# Patient Record
Sex: Female | Born: 1985 | Race: Black or African American | Hispanic: No | Marital: Married | Smoking: Never smoker
Health system: Southern US, Community
[De-identification: ages and names within clinical notes are randomized; demographics above are authoritative.]

## PROBLEM LIST (undated history)

## (undated) ENCOUNTER — Inpatient Hospital Stay (HOSPITAL_COMMUNITY): Payer: Self-pay

## (undated) DIAGNOSIS — D649 Anemia, unspecified: Secondary | ICD-10-CM

## (undated) DIAGNOSIS — E669 Obesity, unspecified: Secondary | ICD-10-CM

## (undated) DIAGNOSIS — R51 Headache: Secondary | ICD-10-CM

## (undated) DIAGNOSIS — F419 Anxiety disorder, unspecified: Secondary | ICD-10-CM

## (undated) DIAGNOSIS — Z789 Other specified health status: Secondary | ICD-10-CM

## (undated) DIAGNOSIS — I10 Essential (primary) hypertension: Secondary | ICD-10-CM

## (undated) HISTORY — DX: Obesity, unspecified: E66.9

## (undated) HISTORY — DX: Headache: R51

## (undated) HISTORY — PX: APPENDECTOMY: SHX54

## (undated) HISTORY — DX: Anemia, unspecified: D64.9

---

## 2009-03-28 ENCOUNTER — Inpatient Hospital Stay (HOSPITAL_COMMUNITY): Admission: AD | Admit: 2009-03-28 | Discharge: 2009-03-28 | Payer: Self-pay | Admitting: Obstetrics & Gynecology

## 2009-04-11 ENCOUNTER — Inpatient Hospital Stay (HOSPITAL_COMMUNITY): Admission: AD | Admit: 2009-04-11 | Discharge: 2009-04-11 | Payer: Self-pay | Admitting: Obstetrics & Gynecology

## 2009-05-20 ENCOUNTER — Inpatient Hospital Stay (HOSPITAL_COMMUNITY): Admission: EM | Admit: 2009-05-20 | Discharge: 2009-05-23 | Payer: Self-pay | Admitting: Emergency Medicine

## 2009-05-20 ENCOUNTER — Encounter: Payer: Self-pay | Admitting: Obstetrics & Gynecology

## 2009-05-21 ENCOUNTER — Encounter (INDEPENDENT_AMBULATORY_CARE_PROVIDER_SITE_OTHER): Payer: Self-pay | Admitting: General Surgery

## 2009-06-05 ENCOUNTER — Emergency Department (HOSPITAL_COMMUNITY): Admission: EM | Admit: 2009-06-05 | Discharge: 2009-06-06 | Payer: Self-pay | Admitting: Emergency Medicine

## 2009-06-26 ENCOUNTER — Inpatient Hospital Stay (HOSPITAL_COMMUNITY): Admission: AD | Admit: 2009-06-26 | Discharge: 2009-06-26 | Payer: Self-pay | Admitting: Obstetrics & Gynecology

## 2009-06-26 ENCOUNTER — Ambulatory Visit: Payer: Self-pay | Admitting: Advanced Practice Midwife

## 2009-07-08 ENCOUNTER — Ambulatory Visit (HOSPITAL_COMMUNITY): Admission: RE | Admit: 2009-07-08 | Discharge: 2009-07-08 | Payer: Self-pay | Admitting: Obstetrics & Gynecology

## 2009-08-01 ENCOUNTER — Inpatient Hospital Stay (HOSPITAL_COMMUNITY): Admission: AD | Admit: 2009-08-01 | Discharge: 2009-08-01 | Payer: Self-pay | Admitting: Obstetrics & Gynecology

## 2009-08-01 ENCOUNTER — Ambulatory Visit: Payer: Self-pay | Admitting: Obstetrics and Gynecology

## 2009-09-04 ENCOUNTER — Inpatient Hospital Stay (HOSPITAL_COMMUNITY): Admission: AD | Admit: 2009-09-04 | Discharge: 2009-09-04 | Payer: Self-pay | Admitting: Obstetrics & Gynecology

## 2009-10-06 ENCOUNTER — Ambulatory Visit: Payer: Self-pay | Admitting: Obstetrics and Gynecology

## 2009-10-06 ENCOUNTER — Observation Stay (HOSPITAL_COMMUNITY): Admission: AD | Admit: 2009-10-06 | Discharge: 2009-10-07 | Payer: Self-pay | Admitting: Family Medicine

## 2009-10-20 ENCOUNTER — Inpatient Hospital Stay (HOSPITAL_COMMUNITY): Admission: AD | Admit: 2009-10-20 | Discharge: 2009-10-21 | Payer: Self-pay | Admitting: Obstetrics & Gynecology

## 2009-10-29 ENCOUNTER — Ambulatory Visit: Payer: Self-pay | Admitting: Obstetrics and Gynecology

## 2009-10-29 ENCOUNTER — Inpatient Hospital Stay (HOSPITAL_COMMUNITY): Admission: AD | Admit: 2009-10-29 | Discharge: 2009-10-29 | Payer: Self-pay | Admitting: Obstetrics & Gynecology

## 2009-11-07 ENCOUNTER — Ambulatory Visit: Payer: Self-pay | Admitting: Advanced Practice Midwife

## 2009-11-07 ENCOUNTER — Inpatient Hospital Stay (HOSPITAL_COMMUNITY): Admission: AD | Admit: 2009-11-07 | Discharge: 2009-11-07 | Payer: Self-pay | Admitting: Obstetrics & Gynecology

## 2009-11-13 ENCOUNTER — Inpatient Hospital Stay (HOSPITAL_COMMUNITY): Admission: AD | Admit: 2009-11-13 | Discharge: 2009-11-13 | Payer: Self-pay | Admitting: Obstetrics

## 2009-11-13 ENCOUNTER — Ambulatory Visit: Payer: Self-pay | Admitting: Physician Assistant

## 2009-11-16 ENCOUNTER — Inpatient Hospital Stay (HOSPITAL_COMMUNITY): Admission: AD | Admit: 2009-11-16 | Discharge: 2009-11-18 | Payer: Self-pay | Admitting: Obstetrics & Gynecology

## 2009-11-16 ENCOUNTER — Ambulatory Visit: Payer: Self-pay | Admitting: Obstetrics and Gynecology

## 2010-10-11 LAB — WET PREP, GENITAL
Trich, Wet Prep: NONE SEEN
Yeast Wet Prep HPF POC: NONE SEEN

## 2010-10-11 LAB — URINALYSIS, ROUTINE W REFLEX MICROSCOPIC
Glucose, UA: NEGATIVE mg/dL
Leukocytes, UA: NEGATIVE
Nitrite: NEGATIVE
Specific Gravity, Urine: 1.01 (ref 1.005–1.030)
Urobilinogen, UA: 0.2 mg/dL (ref 0.0–1.0)

## 2010-10-11 LAB — URINE MICROSCOPIC-ADD ON

## 2010-10-11 LAB — GC/CHLAMYDIA PROBE AMP, GENITAL: GC Probe Amp, Genital: NEGATIVE

## 2010-10-13 LAB — URINALYSIS, ROUTINE W REFLEX MICROSCOPIC
Bilirubin Urine: NEGATIVE
Bilirubin Urine: NEGATIVE
Glucose, UA: NEGATIVE mg/dL
Hgb urine dipstick: NEGATIVE
Hgb urine dipstick: NEGATIVE
Ketones, ur: 15 mg/dL — AB
Ketones, ur: NEGATIVE mg/dL
Protein, ur: NEGATIVE mg/dL
Protein, ur: NEGATIVE mg/dL
Urobilinogen, UA: 0.2 mg/dL (ref 0.0–1.0)
Urobilinogen, UA: 0.2 mg/dL (ref 0.0–1.0)

## 2010-10-13 LAB — URIC ACID: Uric Acid, Serum: 6 mg/dL (ref 2.4–7.0)

## 2010-10-13 LAB — CBC
HCT: 33.3 % — ABNORMAL LOW (ref 36.0–46.0)
HCT: 38 % (ref 36.0–46.0)
MCHC: 33.4 g/dL (ref 30.0–36.0)
MCV: 87.6 fL (ref 78.0–100.0)
MCV: 87.8 fL (ref 78.0–100.0)
Platelets: 158 10*3/uL (ref 150–400)
RBC: 4.33 MIL/uL (ref 3.87–5.11)
WBC: 6.3 10*3/uL (ref 4.0–10.5)

## 2010-10-13 LAB — COMPREHENSIVE METABOLIC PANEL
AST: 22 U/L (ref 0–37)
AST: 28 U/L (ref 0–37)
Albumin: 2.9 g/dL — ABNORMAL LOW (ref 3.5–5.2)
Albumin: 3.2 g/dL — ABNORMAL LOW (ref 3.5–5.2)
Alkaline Phosphatase: 187 U/L — ABNORMAL HIGH (ref 39–117)
BUN: 6 mg/dL (ref 6–23)
CO2: 23 mEq/L (ref 19–32)
Calcium: 9.2 mg/dL (ref 8.4–10.5)
Chloride: 103 mEq/L (ref 96–112)
Creatinine, Ser: 0.59 mg/dL (ref 0.4–1.2)
Creatinine, Ser: 0.61 mg/dL (ref 0.4–1.2)
GFR calc Af Amer: >60 mL/min (ref >60)
GFR calc Af Amer: >60 mL/min (ref >60)
GFR calc non Af Amer: >60 mL/min (ref >60)
GFR calc non Af Amer: >60 mL/min (ref >60)
Potassium: 3.8 mEq/L (ref 3.5–5.1)
Total Bilirubin: 0.7 mg/dL (ref 0.3–1.2)

## 2010-10-14 LAB — COMPREHENSIVE METABOLIC PANEL
AST: 18 U/L (ref 0–37)
BUN: 3 mg/dL — ABNORMAL LOW (ref 6–23)
CO2: 24 mEq/L (ref 19–32)
Calcium: 8.8 mg/dL (ref 8.4–10.5)
Chloride: 105 mEq/L (ref 96–112)
Creatinine, Ser: 0.44 mg/dL (ref 0.4–1.2)
GFR calc non Af Amer: >60 mL/min (ref >60)
Glucose, Bld: 94 mg/dL (ref 70–99)
Total Bilirubin: 0.4 mg/dL (ref 0.3–1.2)

## 2010-10-19 LAB — URINALYSIS, ROUTINE W REFLEX MICROSCOPIC
Bilirubin Urine: NEGATIVE
Nitrite: NEGATIVE
Specific Gravity, Urine: 1.01 (ref 1.005–1.030)
Urobilinogen, UA: 0.2 mg/dL (ref 0.0–1.0)
pH: 6 (ref 5.0–8.0)

## 2010-10-19 LAB — CBC
Hemoglobin: 11.7 g/dL — ABNORMAL LOW (ref 12.0–15.0)
MCHC: 33.7 g/dL (ref 30.0–36.0)
MCV: 86.9 fL (ref 78.0–100.0)
RBC: 4.01 MIL/uL (ref 3.87–5.11)
RDW: 12.8 % (ref 11.5–15.5)

## 2010-10-19 LAB — STREP B DNA PROBE: Strep Group B Ag: NEGATIVE

## 2010-10-27 LAB — URINALYSIS, ROUTINE W REFLEX MICROSCOPIC
Glucose, UA: NEGATIVE mg/dL
Hgb urine dipstick: NEGATIVE
Ketones, ur: NEGATIVE mg/dL
Protein, ur: NEGATIVE mg/dL
Urobilinogen, UA: 0.2 mg/dL (ref 0.0–1.0)

## 2010-10-28 LAB — CBC
HCT: 36.2 % (ref 36.0–46.0)
Hemoglobin: 11.9 g/dL — ABNORMAL LOW (ref 12.0–15.0)
MCHC: 33 g/dL (ref 30.0–36.0)
MCV: 85.5 fL (ref 78.0–100.0)
RDW: 11.7 % (ref 11.5–15.5)

## 2010-10-28 LAB — DIFFERENTIAL
Basophils Relative: 1 % (ref 0–1)
Lymphocytes Relative: 31 % (ref 12–46)
Monocytes Relative: 5 % (ref 3–12)
Neutro Abs: 5 10*3/uL (ref 1.7–7.7)
Neutrophils Relative %: 58 % (ref 43–77)

## 2010-10-28 LAB — URINALYSIS, ROUTINE W REFLEX MICROSCOPIC
Bilirubin Urine: NEGATIVE
Glucose, UA: NEGATIVE mg/dL
Ketones, ur: NEGATIVE mg/dL
Protein, ur: NEGATIVE mg/dL
pH: 5.5 (ref 5.0–8.0)

## 2010-10-28 LAB — COMPREHENSIVE METABOLIC PANEL
Alkaline Phosphatase: 35 U/L — ABNORMAL LOW (ref 39–117)
BUN: 7 mg/dL (ref 6–23)
Calcium: 9.3 mg/dL (ref 8.4–10.5)
Creatinine, Ser: 0.47 mg/dL (ref 0.4–1.2)
Glucose, Bld: 80 mg/dL (ref 70–99)
Total Protein: 7.3 g/dL (ref 6.0–8.3)

## 2010-10-29 LAB — BASIC METABOLIC PANEL
Calcium: 9.5 mg/dL (ref 8.4–10.5)
Chloride: 102 mEq/L (ref 96–112)
Creatinine, Ser: 0.47 mg/dL (ref 0.4–1.2)
GFR calc Af Amer: >60 mL/min (ref >60)
GFR calc non Af Amer: >60 mL/min (ref >60)
Potassium: 3.3 mEq/L — ABNORMAL LOW (ref 3.5–5.1)
Sodium: 134 mEq/L — ABNORMAL LOW (ref 135–145)

## 2010-10-29 LAB — DIFFERENTIAL
Basophils Absolute: 0 10*3/uL (ref 0.0–0.1)
Basophils Relative: 0 % (ref 0–1)
Lymphocytes Relative: 6 % — ABNORMAL LOW (ref 12–46)
Lymphocytes Relative: 8 % — ABNORMAL LOW (ref 12–46)
Lymphs Abs: 0.6 10*3/uL — ABNORMAL LOW (ref 0.7–4.0)
Monocytes Absolute: 0.2 10*3/uL (ref 0.1–1.0)
Monocytes Relative: 2 % — ABNORMAL LOW (ref 3–12)
Neutro Abs: 9.4 10*3/uL — ABNORMAL HIGH (ref 1.7–7.7)
Neutrophils Relative %: 90 % — ABNORMAL HIGH (ref 43–77)
Neutrophils Relative %: 92 % — ABNORMAL HIGH (ref 43–77)

## 2010-10-29 LAB — CBC
HCT: 31.8 % — ABNORMAL LOW (ref 36.0–46.0)
HCT: 33.9 % — ABNORMAL LOW (ref 36.0–46.0)
Hemoglobin: 10.6 g/dL — ABNORMAL LOW (ref 12.0–15.0)
Hemoglobin: 12.1 g/dL (ref 12.0–15.0)
MCHC: 33.5 g/dL (ref 30.0–36.0)
MCV: 86.7 fL (ref 78.0–100.0)
Platelets: 150 10*3/uL (ref 150–400)
RBC: 4.27 MIL/uL (ref 3.87–5.11)
RDW: 12.8 % (ref 11.5–15.5)
WBC: 10.3 10*3/uL (ref 4.0–10.5)
WBC: 4.9 10*3/uL (ref 4.0–10.5)

## 2010-10-29 LAB — URINALYSIS, ROUTINE W REFLEX MICROSCOPIC
Glucose, UA: NEGATIVE mg/dL
Protein, ur: NEGATIVE mg/dL
Urobilinogen, UA: 1 mg/dL (ref 0.0–1.0)

## 2010-10-29 LAB — TYPE AND SCREEN
ABO/RH(D): A POS
Antibody Screen: NEGATIVE

## 2010-10-29 LAB — ABO/RH: ABO/RH(D): A POS

## 2010-10-29 LAB — PROTIME-INR
INR: 1 (ref 0.00–1.49)
Prothrombin Time: 13.1 seconds (ref 11.6–15.2)

## 2010-10-29 LAB — APTT: aPTT: 28 seconds (ref 24–37)

## 2010-10-30 LAB — COMPREHENSIVE METABOLIC PANEL
ALT: 17 U/L (ref 0–35)
BUN: 6 mg/dL (ref 6–23)
CO2: 26 mEq/L (ref 19–32)
Calcium: 9.5 mg/dL (ref 8.4–10.5)
GFR calc non Af Amer: >60 mL/min (ref >60)
Glucose, Bld: 88 mg/dL (ref 70–99)
Sodium: 136 mEq/L (ref 135–145)

## 2010-10-30 LAB — GC/CHLAMYDIA PROBE AMP, GENITAL: Chlamydia, DNA Probe: NEGATIVE

## 2010-10-30 LAB — URINALYSIS, ROUTINE W REFLEX MICROSCOPIC
Bilirubin Urine: NEGATIVE
Glucose, UA: NEGATIVE mg/dL
Hgb urine dipstick: NEGATIVE
Ketones, ur: NEGATIVE mg/dL
Ketones, ur: NEGATIVE mg/dL
Nitrite: NEGATIVE
Protein, ur: NEGATIVE mg/dL
Specific Gravity, Urine: 1.02 (ref 1.005–1.030)
pH: 6 (ref 5.0–8.0)
pH: 7 (ref 5.0–8.0)

## 2010-10-30 LAB — CBC
HCT: 36.8 % (ref 36.0–46.0)
HCT: 38 % (ref 36.0–46.0)
Hemoglobin: 12.4 g/dL (ref 12.0–15.0)
Hemoglobin: 12.6 g/dL (ref 12.0–15.0)
MCHC: 33.6 g/dL (ref 30.0–36.0)
MCV: 85 fL (ref 78.0–100.0)
RBC: 4.33 MIL/uL (ref 3.87–5.11)
WBC: 5.5 10*3/uL (ref 4.0–10.5)

## 2010-10-30 LAB — WET PREP, GENITAL

## 2010-10-30 LAB — DIFFERENTIAL
Eosinophils Relative: 8 % — ABNORMAL HIGH (ref 0–5)
Lymphocytes Relative: 33 % (ref 12–46)
Lymphs Abs: 1.8 10*3/uL (ref 0.7–4.0)
Monocytes Absolute: 0.3 10*3/uL (ref 0.1–1.0)

## 2012-01-04 ENCOUNTER — Inpatient Hospital Stay (HOSPITAL_COMMUNITY)
Admission: AD | Admit: 2012-01-04 | Discharge: 2012-01-04 | Disposition: A | Discharge status: Home / Self Care | Payer: 59 | Source: Ambulatory Visit | Attending: Family Medicine | Admitting: Family Medicine

## 2012-01-04 ENCOUNTER — Encounter (HOSPITAL_COMMUNITY): Payer: Self-pay

## 2012-01-04 DIAGNOSIS — E860 Dehydration: Secondary | ICD-10-CM | POA: Insufficient documentation

## 2012-01-04 DIAGNOSIS — O211 Hyperemesis gravidarum with metabolic disturbance: Secondary | ICD-10-CM | POA: Insufficient documentation

## 2012-01-04 DIAGNOSIS — R1110 Vomiting, unspecified: Secondary | ICD-10-CM

## 2012-01-04 DIAGNOSIS — R1115 Cyclical vomiting syndrome unrelated to migraine: Secondary | ICD-10-CM

## 2012-01-04 HISTORY — DX: Other specified health status: Z78.9

## 2012-01-04 LAB — URINALYSIS, ROUTINE W REFLEX MICROSCOPIC
Glucose, UA: NEGATIVE mg/dL
Hgb urine dipstick: NEGATIVE
Specific Gravity, Urine: >1.03 — ABNORMAL HIGH (ref 1.005–1.030)

## 2012-01-04 LAB — CBC
HCT: 37.3 % (ref 36.0–46.0)
Hemoglobin: 12.6 g/dL (ref 12.0–15.0)
RDW: 12.2 % (ref 11.5–15.5)
WBC: 5.1 10*3/uL (ref 4.0–10.5)

## 2012-01-04 LAB — POCT PREGNANCY, URINE: Preg Test, Ur: POSITIVE — AB

## 2012-01-04 MED ORDER — PROMETHAZINE HCL 25 MG PO TABS: 25.0000 mg | ORAL_TABLET | Freq: Four times a day (QID) | ORAL | Status: DC | PRN

## 2012-01-04 MED ORDER — SODIUM CHLORIDE 0.9 % IV SOLN
25.0000 mg | Freq: Once | INTRAVENOUS | Status: AC
  Administered 2012-01-04: 25 mg via INTRAVENOUS
  Filled 2012-01-04: qty 1

## 2012-01-04 MED ORDER — ONDANSETRON 4 MG PO TBDP
4.0000 mg | ORAL_TABLET | Freq: Once | ORAL | Status: AC
  Administered 2012-01-04: 4 mg via ORAL
  Filled 2012-01-04: qty 1

## 2012-01-04 MED ORDER — SODIUM CHLORIDE 0.9 % IV SOLN
25.0000 mg | INTRAVENOUS | Status: DC
  Filled 2012-01-04: qty 1

## 2012-01-04 NOTE — MAU Note (Signed)
Pt reports for the last week she has been unable to eat anything without vomiting. Denies fever. Complains of lower abd and lower back pain also for one week. Denies dysuria.  LMP 10/25/2010

## 2012-01-04 NOTE — MAU Note (Signed)
Pt thinks last period was the first week of April but is unsure as periods have been irregular since last Depo Provera shot August 2012.

## 2012-01-04 NOTE — MAU Provider Note (Signed)
Nechama Guard y.o.G3P1011 @[redacted]w[redacted]d  by LMP Chief Complaint  Patient presents with  . Abdominal Pain    Provider at bedside at 2030    SUBJECTIVE  HPI:  26 -year-old G3 P1011 presents with nausea and vomiting of 6 days duration. She also has heartburn and spitting. Today she vomited 5 times which is more than usual. She has not taken any antiemetics. Today she feels weak and fatigued. Her menstrual cycles have been irregular since she came off Depo in August 2012.   Past Medical History  Diagnosis Date  . No pertinent past medical history    Past Surgical History  Procedure Date  . Appendectomy    History   Social History  . Marital Status: Single    Spouse Name: N/A    Number of Children: N/A  . Years of Education: N/A   Occupational History  . Not on file.   Social History Main Topics  . Smoking status: Never Smoker   . Smokeless tobacco: Not on file  . Alcohol Use: No  . Drug Use: No  . Sexually Active: Yes   Other Topics Concern  . Not on file   Social History Narrative  . No narrative on file   No current facility-administered medications on file prior to encounter.   No current outpatient prescriptions on file prior to encounter.   No Known Allergies  ROS: Pertinent items in HPI  OBJECTIVE Blood pressure 122/72, pulse 74, temperature 98.8 F (37.1 C), temperature source Oral, resp. rate 16, height 5\' 5"  (1.651 m), weight 86.183 kg (190 lb), last menstrual period 10/25/2011, SpO2 100.00%.  GENERAL: Well-developed, well-nourished female in no acute distress.  HEENT: Normocephalic, good dentition HEART: normal rate RESP: normal effort ABDOMEN: Soft, nontender EXTREMITIES: Nontender, no edema NEURO: Alert and oriented  LAB RESULTS Results for orders placed during the hospital encounter of 01/04/12 (from the past 24 hour(s))  URINALYSIS, ROUTINE W REFLEX MICROSCOPIC     Status: Abnormal   Collection Time   01/04/12  7:45 PM      Component Value Range    Color, Urine YELLOW  YELLOW    APPearance CLEAR  CLEAR    Specific Gravity, Urine >1.030 (*) 1.005 - 1.030    pH 6.0  5.0 - 8.0    Glucose, UA NEGATIVE  NEGATIVE (mg/dL)   Hgb urine dipstick NEGATIVE  NEGATIVE    Bilirubin Urine NEGATIVE  NEGATIVE    Ketones, ur 15 (*) NEGATIVE (mg/dL)   Protein, ur NEGATIVE  NEGATIVE (mg/dL)   Urobilinogen, UA 1.0  0.0 - 1.0 (mg/dL)   Nitrite NEGATIVE  NEGATIVE    Leukocytes, UA NEGATIVE  NEGATIVE   POCT PREGNANCY, URINE     Status: Abnormal   Collection Time   01/04/12  7:58 PM      Component Value Range   Preg Test, Ur POSITIVE (*) NEGATIVE      MAU Course: Zofran given. Still nauseated Bedside US by me: cardiac activity seen  ASSESSMENT Early pregnancy with nausea and vomiting Dehydration  PLAN IVF with Phenrgan Care transferred to Wynelle Bourgeois, CNM at 2100.     POE,DEIRDRE 01/04/2012 8:28 PM  CBC    Component Value Date/Time   WBC 5.1 01/04/2012 2102   RBC 4.57 01/04/2012 2102   HGB 12.6 01/04/2012 2102   HCT 37.3 01/04/2012 2102   PLT 208 01/04/2012 2102   MCV 81.6 01/04/2012 2102   MCH 27.6 01/04/2012 2102   MCHC 33.8 01/04/2012 2102  RDW 12.2 01/04/2012 2102   LYMPHSABS 2.7 06/05/2009 2035   MONOABS 0.5 06/05/2009 2035   EOSABS 0.5 06/05/2009 2035   BASOSABS 0.0 06/05/2009 2035   Feels better. Has had no vomiting Will discharge home with Rx for Phenergan Given list of OB providers

## 2012-01-04 NOTE — Discharge Instructions (Signed)
Morning Sickness Morning sickness is when you feel sick to your stomach (nauseous) during pregnancy. This nauseous feeling may or may not come with throwing up (vomiting). It often occurs in the morning, but can be a problem any time of day. While morning sickness is unpleasant, it is usually harmless unless you develop severe and continual vomiting (hyperemesis gravidarum). This condition requires more intense treatment. CAUSES  The cause of morning sickness is not completely known but seems to be related to a sudden increase of two hormones:   Human chorionic gonadotropin (hCG).   Estrogen hormone.  These are elevated in the first part of the pregnancy. TREATMENT  Do not use any medicines (prescription, over-the-counter, or herbal) for morning sickness without first talking to your caregiver. Some patients are helped by the following:  Vitamin B6 (25mg every 8 hours) or vitamin B6 shots.   An antihistamine called doxylamine (10mg every 8 hours).   The herbal medication ginger.  HOME CARE INSTRUCTIONS   Taking multivitamins before getting pregnant can prevent or decrease the severity of morning sickness in most women.   Eat a piece of dry toast or unsalted crackers before getting out of bed in the morning.   Eat 5 or 6 small meals a day.   Eat dry and bland foods (rice, baked potato).   Do not drink liquids with your meals. Drink liquids between meals.   Avoid greasy, fatty, and spicy foods.   Get someone to cook for you if the smell of any food causes nausea and vomiting.   Avoid vitamin pills with iron because iron can cause nausea.   Snack on protein foods between meals if you are hungry.   Eat unsweetened gelatins for deserts.   Wear an acupressure wristband (worn for sea sickness) may be helpful.   Acupuncture may be helpful.   Do not smoke.   Get a humidifier to keep the air in your house free of odors.  SEEK MEDICAL CARE IF:   Your home remedies are not working  and you need medication.   You feel dizzy or lightheaded.   You are losing weight.   You need help with your diet.  SEEK IMMEDIATE MEDICAL CARE IF:   You have persistent and uncontrolled nausea and vomiting.   You pass out (faint).   You have a fever.  MAKE SURE YOU:   Understand these instructions.   Will watch your condition.   Will get help right away if you are not doing well or get worse.  Document Released: 09/02/2006 Document Revised: 07/01/2011 Document Reviewed: 06/30/2007 [employer] Patient Information 2012 [employer]. 

## 2012-01-05 NOTE — MAU Provider Note (Signed)
Chart reviewed and agree with management and plan.  

## 2012-03-01 ENCOUNTER — Other Ambulatory Visit (HOSPITAL_COMMUNITY): Payer: Self-pay | Admitting: Nurse Practitioner

## 2012-03-01 DIAGNOSIS — Z3689 Encounter for other specified antenatal screening: Secondary | ICD-10-CM

## 2012-03-01 LAB — OB RESULTS CONSOLE HEPATITIS B SURFACE ANTIGEN: Hepatitis B Surface Ag: NEGATIVE

## 2012-03-01 LAB — OB RESULTS CONSOLE ABO/RH

## 2012-03-01 LAB — OB RESULTS CONSOLE HIV ANTIBODY (ROUTINE TESTING): HIV: NONREACTIVE

## 2012-03-01 LAB — OB RESULTS CONSOLE RUBELLA ANTIBODY, IGM: Rubella: IMMUNE

## 2012-03-01 LAB — OB RESULTS CONSOLE GC/CHLAMYDIA
Chlamydia: NEGATIVE
Gonorrhea: NEGATIVE

## 2012-03-07 ENCOUNTER — Ambulatory Visit (HOSPITAL_COMMUNITY)
Admission: RE | Admit: 2012-03-07 | Discharge: 2012-03-07 | Disposition: A | Discharge status: Home / Self Care | Payer: 59 | Source: Ambulatory Visit | Attending: Nurse Practitioner | Admitting: Nurse Practitioner

## 2012-03-07 DIAGNOSIS — O358XX0 Maternal care for other (suspected) fetal abnormality and damage, not applicable or unspecified: Secondary | ICD-10-CM | POA: Insufficient documentation

## 2012-03-07 DIAGNOSIS — Z1389 Encounter for screening for other disorder: Secondary | ICD-10-CM | POA: Insufficient documentation

## 2012-03-07 DIAGNOSIS — Z3689 Encounter for other specified antenatal screening: Secondary | ICD-10-CM

## 2012-03-07 DIAGNOSIS — Z363 Encounter for antenatal screening for malformations: Secondary | ICD-10-CM | POA: Insufficient documentation

## 2012-03-09 ENCOUNTER — Other Ambulatory Visit: Payer: Self-pay | Admitting: Obstetrics and Gynecology

## 2012-03-09 DIAGNOSIS — Z0489 Encounter for examination and observation for other specified reasons: Secondary | ICD-10-CM

## 2012-03-28 ENCOUNTER — Ambulatory Visit (HOSPITAL_COMMUNITY)
Admission: RE | Admit: 2012-03-28 | Discharge: 2012-03-28 | Disposition: A | Discharge status: Home / Self Care | Payer: 59 | Source: Ambulatory Visit | Attending: Obstetrics and Gynecology | Admitting: Obstetrics and Gynecology

## 2012-03-28 DIAGNOSIS — Z0489 Encounter for examination and observation for other specified reasons: Secondary | ICD-10-CM

## 2012-03-28 DIAGNOSIS — Z363 Encounter for antenatal screening for malformations: Secondary | ICD-10-CM | POA: Insufficient documentation

## 2012-03-28 DIAGNOSIS — O358XX0 Maternal care for other (suspected) fetal abnormality and damage, not applicable or unspecified: Secondary | ICD-10-CM | POA: Insufficient documentation

## 2012-03-28 DIAGNOSIS — Z1389 Encounter for screening for other disorder: Secondary | ICD-10-CM | POA: Insufficient documentation

## 2012-04-18 ENCOUNTER — Inpatient Hospital Stay (HOSPITAL_COMMUNITY)
Admission: AD | Admit: 2012-04-18 | Discharge: 2012-04-18 | Disposition: A | Discharge status: Home / Self Care | Payer: 59 | Source: Ambulatory Visit | Attending: Obstetrics & Gynecology | Admitting: Obstetrics & Gynecology

## 2012-04-18 ENCOUNTER — Encounter (HOSPITAL_COMMUNITY): Payer: Self-pay | Admitting: *Deleted

## 2012-04-18 DIAGNOSIS — T148XXA Other injury of unspecified body region, initial encounter: Secondary | ICD-10-CM

## 2012-04-18 DIAGNOSIS — R109 Unspecified abdominal pain: Secondary | ICD-10-CM

## 2012-04-18 DIAGNOSIS — O99891 Other specified diseases and conditions complicating pregnancy: Secondary | ICD-10-CM

## 2012-04-18 LAB — URINALYSIS, ROUTINE W REFLEX MICROSCOPIC
Bilirubin Urine: NEGATIVE
Ketones, ur: 15 mg/dL — AB
Leukocytes, UA: NEGATIVE
Nitrite: NEGATIVE
Specific Gravity, Urine: >1.03 — ABNORMAL HIGH (ref 1.005–1.030)
Urobilinogen, UA: 0.2 mg/dL (ref 0.0–1.0)
pH: 5.5 (ref 5.0–8.0)

## 2012-04-18 NOTE — MAU Provider Note (Signed)
History     CSN: [id number]  Arrival date and time: 04/18/12 9629   First Provider Initiated Contact with Patient 04/18/12 0259      Chief Complaint  Patient presents with  . Abdominal Pain   HPI Comments: 26 yo G3P1011 @ 25.1 p/w abdominal pain- to the right of umbilicus and superior.  Pain is sharp, constant.  No pain when laying, but + pain with movement. Happens irregularly, no LOF or vaginal bleeding. + FM. Pain started Sunday evening when she was cooking and bent down to pick something up.  Since that time, she has had sharp pain that worsens when she sits up or moves in certain directions. No dysuria, no N/V, no diarrhea or constipation.  Had appendix removed previously.  Abdominal Pain This is a new problem. The current episode started yesterday. The onset quality is sudden. The problem occurs constantly. The problem has been unchanged. The quality of the pain is sharp. The abdominal pain does not radiate. Pertinent negatives include no anorexia, belching, constipation, diarrhea, dysuria, fever, flatus, nausea or vomiting. The pain is aggravated by movement and certain positions. The pain is relieved by being still. She has tried nothing for the symptoms. Her past medical history is significant for abdominal surgery.    OB History    Grav Para Term Preterm Abortions TAB SAB Ect Mult Living   3 1 1  1 1    1       Past Medical History  Diagnosis Date  . No pertinent past medical history     Past Surgical History  Procedure Date  . Appendectomy     Family History  Problem Relation Age of Onset  . Anesthesia problems Neg Hx     History  Substance Use Topics  . Smoking status: Never Smoker   . Smokeless tobacco: Not on file  . Alcohol Use: No    Allergies: No Known Allergies  Prescriptions prior to admission  Medication Sig Dispense Refill  . Prenatal Vit-Fe Fumarate-FA (MULTIVITAMIN-PRENATAL) 27-0.8 MG TABS Take 1 tablet by mouth daily.      .  naphazoline-glycerin (CLEAR EYES) 0.012-0.2 % SOLN Place 1-2 drops into both eyes every 4 (four) hours as needed. For eye redness and irritation      . promethazine (PHENERGAN) 25 MG tablet Take 1 tablet (25 mg total) by mouth every 6 (six) hours as needed for nausea.  30 tablet  0    Review of Systems  Constitutional: Negative for fever.  Respiratory: Negative for cough.   Cardiovascular: Negative for chest pain.  Gastrointestinal: Positive for abdominal pain. Negative for nausea, vomiting, diarrhea, constipation, anorexia and flatus.  Genitourinary: Negative for dysuria and flank pain.  Neurological: Negative for dizziness.   Physical Exam   Blood pressure 128/77, pulse 82, temperature 97.2 F (36.2 C), temperature source Oral, resp. rate 18, height 5\' 4"  (1.626 m), weight 87.091 kg (192 lb), last menstrual period 10/25/2011, SpO2 100.00%.  Physical Exam  Vitals reviewed. Constitutional: She appears well-developed. No distress.  HENT:  Head: Normocephalic.  Cardiovascular: Normal rate and regular rhythm.   No murmur heard. Respiratory: Effort normal and breath sounds normal. No respiratory distress.  GI: Soft. Bowel sounds are normal. There is tenderness in the periumbilical area. There is no rebound and no CVA tenderness.         gravid  Musculoskeletal: She exhibits no edema.  Neurological: She is alert.  Skin: Skin is warm and dry.   FHT: 140's, + accels,  mod variability Toco: no ctx  MAU Course  Procedures  MDM Differential includes muscle strain vs post-op adhesions. Reassuring fetal monitoring. No ctx on toco.   Assessment and Plan  26 yo G3P1011 @ 25.1 presents with muscle strain -Tylenol PRN -Heat, ice or icy hot PRN, whatever gives most pain relief -Monitor fetal kick counts -F/u with HD as previously scheduled  Fiana Gladu 04/18/2012, 2:59 AM

## 2012-04-18 NOTE — MAU Note (Signed)
Patient indicates pain in her mid abd x 2 days. Denies bleeding. Some dysuria

## 2012-04-18 NOTE — MAU Provider Note (Signed)
I saw and examined patient and read NST. I agree with above. Napoleon Form, MD

## 2012-05-25 ENCOUNTER — Inpatient Hospital Stay (HOSPITAL_COMMUNITY)
Admission: AD | Admit: 2012-05-25 | Discharge: 2012-05-25 | Disposition: A | Discharge status: Home / Self Care | Payer: 59 | Source: Ambulatory Visit | Attending: Obstetrics & Gynecology | Admitting: Obstetrics & Gynecology

## 2012-05-25 ENCOUNTER — Encounter (HOSPITAL_COMMUNITY): Payer: Self-pay | Admitting: *Deleted

## 2012-05-25 DIAGNOSIS — O99891 Other specified diseases and conditions complicating pregnancy: Secondary | ICD-10-CM | POA: Insufficient documentation

## 2012-05-25 DIAGNOSIS — R51 Headache: Secondary | ICD-10-CM | POA: Insufficient documentation

## 2012-05-25 DIAGNOSIS — J039 Acute tonsillitis, unspecified: Secondary | ICD-10-CM

## 2012-05-25 DIAGNOSIS — J029 Acute pharyngitis, unspecified: Secondary | ICD-10-CM | POA: Insufficient documentation

## 2012-05-25 DIAGNOSIS — J40 Bronchitis, not specified as acute or chronic: Secondary | ICD-10-CM | POA: Insufficient documentation

## 2012-05-25 LAB — URINALYSIS, ROUTINE W REFLEX MICROSCOPIC
Bilirubin Urine: NEGATIVE
Hgb urine dipstick: NEGATIVE
Nitrite: NEGATIVE
Protein, ur: NEGATIVE mg/dL
Urobilinogen, UA: 1 mg/dL (ref 0.0–1.0)

## 2012-05-25 MED ORDER — GUAIFENESIN-CODEINE 100-10 MG/5ML PO SYRP: 5.0000 mL | ORAL_SOLUTION | Freq: Three times a day (TID) | ORAL | Status: DC | PRN

## 2012-05-25 MED ORDER — AZITHROMYCIN 250 MG PO TABS: 250.0000 mg | ORAL_TABLET | Freq: Every day | ORAL | Status: DC

## 2012-05-25 NOTE — MAU Note (Signed)
Pt states that she started having a headache last night and woke up with a fever and sore throat

## 2012-05-25 NOTE — MAU Provider Note (Signed)
Attestation of Attending Supervision of Advanced Practitioner (CNM/NP): Evaluation and management procedures were performed by the Advanced Practitioner under my supervision and collaboration.  I have reviewed the Advanced Practitioner's note and chart, and I agree with the management and plan.  HARRAWAY-SMITH, Latwan Luchsinger 4:51 PM    

## 2012-05-25 NOTE — MAU Provider Note (Signed)
History     CSN: [id number]  Arrival date and time: 05/25/12 1423   First Provider Initiated Contact with Patient 05/25/12 1500      Chief Complaint  Patient presents with  . Headache  . Sore Throat   HPI Annette Sullivan is a 26 y.o. female @ [redacted]w[redacted]d gestation who presents to MAU with sore throat. The sore throat started yesterday. She rates the pain as 8/10. Associated symptoms include fever, chills, ear ache, headache, and aching all over. Productive cough. No problems with the pregnancy today. The history was provided by the patient.  OB History    Grav Para Term Preterm Abortions TAB SAB Ect Mult Living   3 1 1  1 1    1       Past Medical History  Diagnosis Date  . No pertinent past medical history     Past Surgical History  Procedure Date  . Appendectomy     Family History  Problem Relation Age of Onset  . Anesthesia problems Neg Hx     History  Substance Use Topics  . Smoking status: Never Smoker   . Smokeless tobacco: Not on file  . Alcohol Use: No    Allergies: No Known Allergies  Prescriptions prior to admission  Medication Sig Dispense Refill  . acetaminophen (TYLENOL) 325 MG tablet Take 650 mg by mouth every 6 (six) hours as needed. For pain/sore throat      . Prenatal Vit-Fe Fumarate-FA (MULTIVITAMIN-PRENATAL) 27-0.8 MG TABS Take 1 tablet by mouth daily.      . promethazine (PHENERGAN) 25 MG tablet Take 1 tablet (25 mg total) by mouth every 6 (six) hours as needed for nausea.  30 tablet  0    Review of Systems  Constitutional: Positive for fever, chills and malaise/fatigue. Negative for weight loss.  HENT: Positive for ear pain, sore throat and neck pain. Negative for nosebleeds and congestion.   Eyes: Negative for blurred vision, double vision, photophobia and pain.  Respiratory: Positive for cough and sputum production. Negative for shortness of breath and wheezing.   Cardiovascular: Negative for chest pain and palpitations.  Gastrointestinal:  Positive for nausea and constipation. Negative for heartburn, vomiting, abdominal pain and diarrhea.  Genitourinary: Negative for dysuria, urgency and frequency.  Musculoskeletal: Negative for myalgias and back pain.  Skin: Negative for itching and rash.  Neurological: Positive for headaches. Negative for dizziness, sensory change, speech change, seizures and weakness.  Endo/Heme/Allergies: Does not bruise/bleed easily.  Psychiatric/Behavioral: Negative for depression. The patient is not nervous/anxious and does not have insomnia.    Physical Exam   Blood pressure 139/73, pulse 108, temperature 99.2 F (37.3 C), temperature source Oral, resp. rate 18, last menstrual period 10/25/2011.  Physical Exam  Nursing note and vitals reviewed. Constitutional: She is oriented to person, place, and time. She appears well-developed and well-nourished. No distress.  HENT:  Head: Normocephalic.  Right Ear: Tympanic membrane and external ear normal.  Left Ear: Tympanic membrane and external ear normal.  Mouth/Throat: Uvula is midline and mucous membranes are normal. Oropharyngeal exudate and posterior oropharyngeal erythema present.  Eyes: EOM are normal.  Neck: Trachea normal. Neck supple. Muscular tenderness present.       No meningeal signs present.  Cardiovascular:       tachycardia  Respiratory: Effort normal. She has no wheezes. She has no rales.  GI: Soft. There is no tenderness.  Musculoskeletal: Normal range of motion.  Lymphadenopathy:    She has cervical adenopathy.  Neurological: She is alert and oriented to person, place, and time. She has normal strength. No cranial nerve deficit.  Skin: Skin is warm and dry.  Psychiatric: She has a normal mood and affect. Her behavior is normal. Judgment and thought content normal.   EFM: Baseline 155, good accelerations, no decelerations, no contractins  Assessment: 26 y.o. female @ [redacted]w[redacted]d gestation with sore  throat   Pharyngitis/tonsillitis   Bronchitis  Plan:  Z-Pak   Robitussin AC   Return if symptoms worse.   Discussed in detail with patient signs and symptoms to return for. Patient voices understanding. Patient questions answered.   Medication List     As of 05/25/2012  3:33 PM    START taking these medications         azithromycin 250 MG tablet   Commonly known as: ZITHROMAX   Take 1 tablet (250 mg total) by mouth daily. Take first 2 tablets together, then 1 every day until finished.      guaiFENesin-codeine 100-10 MG/5ML syrup   Commonly known as: ROBITUSSIN AC   Take 5 mLs by mouth 3 (three) times daily as needed for cough.      CONTINUE taking these medications         acetaminophen 325 MG tablet   Commonly known as: TYLENOL      multivitamin-prenatal 27-0.8 MG Tabs      promethazine 25 MG tablet   Commonly known as: PHENERGAN   Take 1 tablet (25 mg total) by mouth every 6 (six) hours as needed for nausea.          Where to get your medications    These are the prescriptions that you need to pick up.   You may get these medications from any pharmacy.         azithromycin 250 MG tablet   guaiFENesin-codeine 100-10 MG/5ML syrup               MAU Course: Discussed with Dr. Erin Fulling  Procedures  Dayon Witt, RN, FNP, [facility] 05/25/2012, 3:02 PM

## 2012-07-26 NOTE — L&D Delivery Note (Signed)
Delivery Note LEYDA VANDERWERF is a 27 y.o. [id number] presenting at [redacted]w[redacted]d with elevated blood pressures. Her CMP, platelets and urine protein/creatinine ratio were negative for preeclampsia. She was 4.5 cm dilated on admission and was started on pitocin and had membranes artificially ruptured. She began contracting immediately and progressed precipitously to complete dilation.   At 6:03 PM a viable female was delivered via Vaginal, Spontaneous Delivery (Presentation: ; Occiput Anterior; right hand presenting with face).  APGAR: 9, 9; weight 7 lb 11 oz.   Placenta status: Intact, Spontaneous.  Cord: 3 vessels with the following complications: None.    Anesthesia: Local Epidural  Episiotomy: None Lacerations: 2nd degree;Perineal Suture Repair: 3.0 vicryl Est. Blood Loss (mL): 300  Mom to postpartum.  Baby to nursery-stable.  Bonnita Nasuti 08/24/2012, 6:45 PM  I was present for entire delivery and repair. I agree with above note.

## 2012-08-15 ENCOUNTER — Inpatient Hospital Stay (HOSPITAL_COMMUNITY)
Admission: AD | Admit: 2012-08-15 | Discharge: 2012-08-15 | Disposition: A | Discharge status: Home / Self Care | Payer: 59 | Source: Ambulatory Visit | Attending: Obstetrics & Gynecology | Admitting: Obstetrics & Gynecology

## 2012-08-15 ENCOUNTER — Encounter (HOSPITAL_COMMUNITY): Payer: Self-pay | Admitting: *Deleted

## 2012-08-15 DIAGNOSIS — O139 Gestational [pregnancy-induced] hypertension without significant proteinuria, unspecified trimester: Secondary | ICD-10-CM | POA: Insufficient documentation

## 2012-08-15 DIAGNOSIS — O169 Unspecified maternal hypertension, unspecified trimester: Secondary | ICD-10-CM

## 2012-08-15 DIAGNOSIS — O163 Unspecified maternal hypertension, third trimester: Secondary | ICD-10-CM

## 2012-08-15 DIAGNOSIS — O479 False labor, unspecified: Secondary | ICD-10-CM | POA: Insufficient documentation

## 2012-08-15 LAB — CBC
MCH: 28.1 pg (ref 26.0–34.0)
MCHC: 32.4 g/dL (ref 30.0–36.0)
MCV: 86.9 fL (ref 78.0–100.0)
Platelets: 184 10*3/uL (ref 150–400)
RDW: 13.6 % (ref 11.5–15.5)

## 2012-08-15 LAB — COMPREHENSIVE METABOLIC PANEL
AST: 15 U/L (ref 0–37)
Albumin: 2.8 g/dL — ABNORMAL LOW (ref 3.5–5.2)
CO2: 24 mEq/L (ref 19–32)
Calcium: 9.8 mg/dL (ref 8.4–10.5)
Creatinine, Ser: 0.59 mg/dL (ref 0.50–1.10)
GFR calc non Af Amer: >90 mL/min (ref >90)
Sodium: 137 mEq/L (ref 135–145)
Total Protein: 6.6 g/dL (ref 6.0–8.3)

## 2012-08-15 LAB — PROTEIN / CREATININE RATIO, URINE
Protein Creatinine Ratio: 0.09 (ref 0.00–0.15)
Total Protein, Urine: 10.6 mg/dL

## 2012-08-15 LAB — URINALYSIS, ROUTINE W REFLEX MICROSCOPIC
Bilirubin Urine: NEGATIVE
Leukocytes, UA: NEGATIVE
Nitrite: NEGATIVE
Specific Gravity, Urine: 1.02 (ref 1.005–1.030)
Urobilinogen, UA: 0.2 mg/dL (ref 0.0–1.0)

## 2012-08-15 LAB — URINE MICROSCOPIC-ADD ON

## 2012-08-15 NOTE — MAU Provider Note (Signed)
Chief Complaint:  Labor Eval  First Provider Initiated Contact with Patient 08/15/12 1002     HPI: Annette Sullivan is a 27 y.o. G3P1011 at [redacted]w[redacted]d who presents to maternity admissions reporting labor. Reports being told that her BP was elevated at last few prenatal visits (130's/80's per [facility]). Denies HA, vision changes, epigastric pain, leakage of fluid or vaginal bleeding. Good fetal movement.   Pregnancy Course:   Past Medical History: Past Medical History  Diagnosis Date  . No pertinent past medical history     Past obstetric history: OB History    Grav Para Term Preterm Abortions TAB SAB Ect Mult Living   3 1 1  1 1    1      # Outc Date GA Lbr Len/2nd Wgt Sex Del Anes PTL Lv   1 TAB            2 TRM      SVD   Yes   3 CUR               Past Surgical History: Past Surgical History  Procedure Date  . Appendectomy     Family History: Family History  Problem Relation Age of Onset  . Anesthesia problems Neg Hx     Social History: History  Substance Use Topics  . Smoking status: Never Smoker   . Smokeless tobacco: Not on file  . Alcohol Use: No    Allergies: No Known Allergies  Meds:  Prescriptions prior to admission  Medication Sig Dispense Refill  . acetaminophen (TYLENOL) 325 MG tablet Take 650 mg by mouth every 6 (six) hours as needed. For pain/sore throat      . Prenatal Vit-Fe Fumarate-FA (MULTIVITAMIN-PRENATAL) 27-0.8 MG TABS Take 1 tablet by mouth daily.        ROS: Pertinent findings in history of present illness.  Physical Exam  Blood pressure 145/84, pulse 91, temperature 97.4 F (36.3 C), temperature source Oral, resp. rate 18, height 5\' 5"  (1.651 m), weight 90.447 kg (199 lb 6.4 oz), last menstrual period 10/25/2011. GENERAL: Well-developed, well-nourished female in no acute distress.  HEENT: normocephalic HEART: normal rate RESP: normal effort ABDOMEN: Soft, non-tender, gravid appropriate for gestational age EXTREMITIES: Nontender, no  edema NEURO: alert and oriented SPECULUM EXAM: NEFG, physiologic discharge, no blood, cervix clean Dilation: 3 Effacement (%): 70 Cervical Position: Posterior Station: -3 Presentation: Vertex Exam by:: Sarajane Marek, RNC  FHT:  Baseline 140's , minimal-moderate variability, accelerations present, no decelerations Contractions: irreg, mild-moderate   Labs: No results found for this or any previous visit (from the past 24 hour(s)).  Imaging:  No results found. MAU Course: Cervix unchanged after 1 hour. BP's elevated. Will draw PIH labs, check PC ratio and try to obtain record from last prenatal visit to see if pt meets criteria for gest HTN/Pre-E before determining dispo.  Labs normal.   Assessment: 1. Maternal hypertension in third trimester    Plan: Discharge home Labor and PIH precautions and fetal kick counts  Follow-up Information    Follow up with [facility]. On 08/17/2012.   Contact information:   [address] [place] [address] [phone]      Follow up with [facility]. (As needed if symptoms worsen)    Contact information:   [address] [id number] mc [place] [address] [phone]           Medication List  As of 08/15/2012  9:11 AM    ASK your doctor about these medications         acetaminophen 325 MG tablet   Commonly known as: TYLENOL   Take 650 mg by mouth every 6 (six) hours as needed. For pain/sore throat      multivitamin-prenatal 27-0.8 MG Tabs   Take 1 tablet by mouth daily.        [place], CNM 08/15/2012 9:11 AM

## 2012-08-15 NOTE — MAU Note (Signed)
Patient states she is having contractions every 5 minutes. States having some discharge, no bleeding and reports good fetal movement.

## 2012-08-15 NOTE — MAU Note (Signed)
UC's strong and regular since 0530. States has had some HTN

## 2012-08-16 NOTE — MAU Provider Note (Signed)
While awaiting laboratory results, blood pressure remained within normal range. Patient discharged with plans to follow up with HD this week.  Attestation of Attending Supervision of Advanced Practitioner (CNM/NP): Evaluation and management procedures were performed by the Advanced Practitioner under my supervision and collaboration.  I have reviewed the Advanced Practitioner's note and chart, and I agree with the management and plan.  Demarko Zeimet 08/16/2012 9:23 AM

## 2012-08-21 ENCOUNTER — Other Ambulatory Visit (HOSPITAL_COMMUNITY): Payer: Self-pay | Admitting: Family

## 2012-08-21 DIAGNOSIS — O480 Post-term pregnancy: Secondary | ICD-10-CM

## 2012-08-22 ENCOUNTER — Encounter (HOSPITAL_COMMUNITY): Payer: Self-pay | Admitting: *Deleted

## 2012-08-22 ENCOUNTER — Telehealth (HOSPITAL_COMMUNITY): Payer: Self-pay | Admitting: *Deleted

## 2012-08-22 NOTE — Telephone Encounter (Signed)
Preadmission screen  

## 2012-08-24 ENCOUNTER — Inpatient Hospital Stay (HOSPITAL_COMMUNITY): Payer: 59 | Admitting: Anesthesiology

## 2012-08-24 ENCOUNTER — Ambulatory Visit (HOSPITAL_COMMUNITY): Admission: RE | Admit: 2012-08-24 | Payer: Medicaid Other | Source: Ambulatory Visit

## 2012-08-24 ENCOUNTER — Encounter (HOSPITAL_COMMUNITY): Payer: Self-pay | Admitting: Anesthesiology

## 2012-08-24 ENCOUNTER — Encounter (HOSPITAL_COMMUNITY): Payer: Self-pay | Admitting: *Deleted

## 2012-08-24 ENCOUNTER — Inpatient Hospital Stay (HOSPITAL_COMMUNITY)
Admission: AD | Admit: 2012-08-24 | Discharge: 2012-08-26 | DRG: 775 | Disposition: A | Discharge status: Home / Self Care | Payer: 59 | Source: Ambulatory Visit | Attending: Obstetrics & Gynecology | Admitting: Obstetrics & Gynecology

## 2012-08-24 DIAGNOSIS — O139 Gestational [pregnancy-induced] hypertension without significant proteinuria, unspecified trimester: Principal | ICD-10-CM | POA: Diagnosis present

## 2012-08-24 LAB — COMPREHENSIVE METABOLIC PANEL
Albumin: 2.7 g/dL — ABNORMAL LOW (ref 3.5–5.2)
BUN: 6 mg/dL (ref 6–23)
Chloride: 100 mEq/L (ref 96–112)
Creatinine, Ser: 0.57 mg/dL (ref 0.50–1.10)
GFR calc Af Amer: >90 mL/min (ref >90)
GFR calc non Af Amer: >90 mL/min (ref >90)
Glucose, Bld: 107 mg/dL — ABNORMAL HIGH (ref 70–99)
Total Bilirubin: 0.4 mg/dL (ref 0.3–1.2)

## 2012-08-24 LAB — URINALYSIS, ROUTINE W REFLEX MICROSCOPIC
Bilirubin Urine: NEGATIVE
Ketones, ur: NEGATIVE mg/dL
Nitrite: NEGATIVE
pH: 7 (ref 5.0–8.0)

## 2012-08-24 LAB — CBC
Hemoglobin: 11.2 g/dL — ABNORMAL LOW (ref 12.0–15.0)
MCH: 28.3 pg (ref 26.0–34.0)
MCHC: 32.7 g/dL (ref 30.0–36.0)
Platelets: 188 10*3/uL (ref 150–400)

## 2012-08-24 LAB — PROTEIN / CREATININE RATIO, URINE
Creatinine, Urine: 155.08 mg/dL
Total Protein, Urine: 31.4 mg/dL

## 2012-08-24 LAB — RPR: RPR Ser Ql: NONREACTIVE

## 2012-08-24 LAB — URINE MICROSCOPIC-ADD ON

## 2012-08-24 MED ORDER — FENTANYL 2.5 MCG/ML BUPIVACAINE 1/10 % EPIDURAL INFUSION (WH - ANES)
INTRAMUSCULAR | Status: AC
  Filled 2012-08-24: qty 125

## 2012-08-24 MED ORDER — TETANUS-DIPHTH-ACELL PERTUSSIS 5-2.5-18.5 LF-MCG/0.5 IM SUSP
0.5000 mL | Freq: Once | INTRAMUSCULAR | Status: AC
  Administered 2012-08-25: 0.5 mL via INTRAMUSCULAR
  Filled 2012-08-24: qty 0.5

## 2012-08-24 MED ORDER — ACETAMINOPHEN 325 MG PO TABS: 650.0000 mg | ORAL_TABLET | ORAL | Status: DC | PRN

## 2012-08-24 MED ORDER — OXYTOCIN 40 UNITS IN LACTATED RINGERS INFUSION - SIMPLE MED
1.0000 m[IU]/min | INTRAVENOUS | Status: DC
  Administered 2012-08-24: 2 m[IU]/min via INTRAVENOUS
  Filled 2012-08-24: qty 1000

## 2012-08-24 MED ORDER — PHENYLEPHRINE 40 MCG/ML (10ML) SYRINGE FOR IV PUSH (FOR BLOOD PRESSURE SUPPORT): 80.0000 ug | PREFILLED_SYRINGE | INTRAVENOUS | Status: DC | PRN

## 2012-08-24 MED ORDER — EPHEDRINE 5 MG/ML INJ
INTRAVENOUS | Status: AC
  Filled 2012-08-24: qty 4

## 2012-08-24 MED ORDER — DIBUCAINE 1 % RE OINT: 1.0000 "application " | TOPICAL_OINTMENT | RECTAL | Status: DC | PRN

## 2012-08-24 MED ORDER — SENNOSIDES-DOCUSATE SODIUM 8.6-50 MG PO TABS
2.0000 | ORAL_TABLET | Freq: Every day | ORAL | Status: DC
  Administered 2012-08-24 – 2012-08-25 (×2): 2 via ORAL

## 2012-08-24 MED ORDER — FENTANYL 2.5 MCG/ML BUPIVACAINE 1/10 % EPIDURAL INFUSION (WH - ANES)
INTRAMUSCULAR | Status: DC | PRN
  Administered 2012-08-24: 14 mL/h via EPIDURAL

## 2012-08-24 MED ORDER — PHENYLEPHRINE 40 MCG/ML (10ML) SYRINGE FOR IV PUSH (FOR BLOOD PRESSURE SUPPORT)
PREFILLED_SYRINGE | INTRAVENOUS | Status: AC
  Filled 2012-08-24: qty 5

## 2012-08-24 MED ORDER — LIDOCAINE HCL (PF) 1 % IJ SOLN
30.0000 mL | INTRAMUSCULAR | Status: DC | PRN
  Administered 2012-08-24: 30 mL via SUBCUTANEOUS
  Filled 2012-08-24: qty 30

## 2012-08-24 MED ORDER — IBUPROFEN 600 MG PO TABS
600.0000 mg | ORAL_TABLET | Freq: Four times a day (QID) | ORAL | Status: DC | PRN
  Administered 2012-08-24: 600 mg via ORAL
  Filled 2012-08-24: qty 1

## 2012-08-24 MED ORDER — EPHEDRINE 5 MG/ML INJ: 10.0000 mg | INTRAVENOUS | Status: DC | PRN

## 2012-08-24 MED ORDER — OXYCODONE-ACETAMINOPHEN 5-325 MG PO TABS
1.0000 | ORAL_TABLET | ORAL | Status: DC | PRN
  Administered 2012-08-24: 1 via ORAL
  Filled 2012-08-24: qty 1

## 2012-08-24 MED ORDER — OXYCODONE-ACETAMINOPHEN 5-325 MG PO TABS
1.0000 | ORAL_TABLET | ORAL | Status: DC | PRN
  Administered 2012-08-25 (×2): 1 via ORAL
  Filled 2012-08-24 (×2): qty 1

## 2012-08-24 MED ORDER — FLEET ENEMA 7-19 GM/118ML RE ENEM: 1.0000 | ENEMA | RECTAL | Status: DC | PRN

## 2012-08-24 MED ORDER — OXYTOCIN BOLUS FROM INFUSION: 500.0000 mL | INTRAVENOUS | Status: DC

## 2012-08-24 MED ORDER — ZOLPIDEM TARTRATE 5 MG PO TABS: 5.0000 mg | ORAL_TABLET | Freq: Every evening | ORAL | Status: DC | PRN

## 2012-08-24 MED ORDER — LACTATED RINGERS IV SOLN: 500.0000 mL | INTRAVENOUS | Status: DC | PRN

## 2012-08-24 MED ORDER — ONDANSETRON HCL 4 MG PO TABS: 4.0000 mg | ORAL_TABLET | ORAL | Status: DC | PRN

## 2012-08-24 MED ORDER — WITCH HAZEL-GLYCERIN EX PADS: 1.0000 "application " | MEDICATED_PAD | CUTANEOUS | Status: DC | PRN

## 2012-08-24 MED ORDER — ONDANSETRON HCL 4 MG/2ML IJ SOLN: 4.0000 mg | Freq: Four times a day (QID) | INTRAMUSCULAR | Status: DC | PRN

## 2012-08-24 MED ORDER — BENZOCAINE-MENTHOL 20-0.5 % EX AERO: 1.0000 "application " | INHALATION_SPRAY | CUTANEOUS | Status: DC | PRN

## 2012-08-24 MED ORDER — SIMETHICONE 80 MG PO CHEW: 80.0000 mg | CHEWABLE_TABLET | ORAL | Status: DC | PRN

## 2012-08-24 MED ORDER — LACTATED RINGERS IV SOLN
500.0000 mL | Freq: Once | INTRAVENOUS | Status: AC
  Administered 2012-08-24: 1000 mL via INTRAVENOUS

## 2012-08-24 MED ORDER — OXYTOCIN 40 UNITS IN LACTATED RINGERS INFUSION - SIMPLE MED
62.5000 mL/h | INTRAVENOUS | Status: DC
  Administered 2012-08-24: 62.5 mL/h via INTRAVENOUS

## 2012-08-24 MED ORDER — ONDANSETRON HCL 4 MG/2ML IJ SOLN: 4.0000 mg | INTRAMUSCULAR | Status: DC | PRN

## 2012-08-24 MED ORDER — FENTANYL 2.5 MCG/ML BUPIVACAINE 1/10 % EPIDURAL INFUSION (WH - ANES): 14.0000 mL/h | INTRAMUSCULAR | Status: DC

## 2012-08-24 MED ORDER — DIPHENHYDRAMINE HCL 25 MG PO CAPS: 25.0000 mg | ORAL_CAPSULE | Freq: Four times a day (QID) | ORAL | Status: DC | PRN

## 2012-08-24 MED ORDER — LACTATED RINGERS IV SOLN
INTRAVENOUS | Status: DC
  Administered 2012-08-24: 15:00:00 via INTRAVENOUS

## 2012-08-24 MED ORDER — LIDOCAINE HCL (PF) 1 % IJ SOLN
INTRAMUSCULAR | Status: DC | PRN
  Administered 2012-08-24 (×2): 4 mL

## 2012-08-24 MED ORDER — TERBUTALINE SULFATE 1 MG/ML IJ SOLN: 0.2500 mg | Freq: Once | INTRAMUSCULAR | Status: DC | PRN

## 2012-08-24 MED ORDER — LANOLIN HYDROUS EX OINT: TOPICAL_OINTMENT | CUTANEOUS | Status: DC | PRN

## 2012-08-24 MED ORDER — PRENATAL MULTIVITAMIN CH
1.0000 | ORAL_TABLET | Freq: Every day | ORAL | Status: DC
  Administered 2012-08-25 – 2012-08-26 (×2): 1 via ORAL
  Filled 2012-08-24 (×2): qty 1

## 2012-08-24 MED ORDER — CITRIC ACID-SODIUM CITRATE 334-500 MG/5ML PO SOLN: 30.0000 mL | ORAL | Status: DC | PRN

## 2012-08-24 MED ORDER — IBUPROFEN 600 MG PO TABS
600.0000 mg | ORAL_TABLET | Freq: Four times a day (QID) | ORAL | Status: DC
  Administered 2012-08-25 – 2012-08-26 (×7): 600 mg via ORAL
  Filled 2012-08-24 (×8): qty 1

## 2012-08-24 MED ORDER — DIPHENHYDRAMINE HCL 50 MG/ML IJ SOLN: 12.5000 mg | INTRAMUSCULAR | Status: DC | PRN

## 2012-08-24 NOTE — Anesthesia Procedure Notes (Signed)
Epidural Patient location during procedure: OB Start time: 08/24/2012 5:08 PM  Staffing Anesthesiologist: Tarquin Welcher A. Performed by: anesthesiologist   Preanesthetic Checklist Completed: patient identified, site marked, surgical consent, pre-op evaluation, timeout performed, IV checked, risks and benefits discussed and monitors and equipment checked  Epidural Patient position: sitting Prep: site prepped and draped and DuraPrep Patient monitoring: continuous pulse ox and blood pressure Approach: midline Injection technique: LOR air  Needle:  Needle type: Tuohy  Needle gauge: 17 G Needle length: 9 cm and 9 Needle insertion depth: 5 cm cm Catheter type: closed end flexible Catheter size: 19 Gauge Catheter at skin depth: 10 cm Test dose: negative and Other  Assessment Events: blood not aspirated, injection not painful, no injection resistance, negative IV test and no paresthesia  Additional Notes Patient identified. Risks and benefits discussed including failed block, incomplete  Pain control, post dural puncture headache, nerve damage, paralysis, blood pressure Changes, nausea, vomiting, reactions to medications-both toxic and allergic and post Partum back pain. All questions were answered. Patient expressed understanding and wished to proceed. Sterile technique was used throughout procedure. Epidural site was Dressed with sterile barrier dressing. No paresthesias, signs of intravascular injection Or signs of intrathecal spread were encountered.  Patient was more comfortable after the epidural was dosed. Please see RN's note for documentation of vital signs and FHR which are stable.

## 2012-08-24 NOTE — Progress Notes (Signed)
Patient ID: Annette Sullivan, female   DOB: 11-Jan-1986, 27 y.o.   MRN: [id number]  S:  Pt resting comfortably in bed.  O:  Filed Vitals:   08/24/12 1601  BP: 127/81  Pulse: 84  Temp:   Resp: 20    Cervix:  4/50/-2  FHTs:  140, mod variability, accels present, no decels.  TOCO:  Occasional contraction  AROM, clear  A/P Induction for GHTN Pitocin, AROM Anticipate SVD BP controlled, CMP, platelets normal. Urine Pr/Creat pending  Napoleon Form, MD

## 2012-08-24 NOTE — Anesthesia Preprocedure Evaluation (Signed)
Anesthesia Evaluation  Patient identified by MRN, date of birth, ID band Patient awake    Reviewed: Allergy & Precautions, H&P , Patient's Chart, lab work & pertinent test results  Airway Mallampati: III TM Distance: >3 FB Neck ROM: full    Dental No notable dental hx. (+) Teeth Intact   Pulmonary neg pulmonary ROS,  breath sounds clear to auscultation  Pulmonary exam normal       Cardiovascular hypertension, Rhythm:regular Rate:Normal  PIH   Neuro/Psych  Headaches, negative psych ROS   GI/Hepatic negative GI ROS, Neg liver ROS,   Endo/Other  Obesity  Renal/GU negative Renal ROS  negative genitourinary   Musculoskeletal   Abdominal Normal abdominal exam  (+)   Peds  Hematology negative hematology ROS (+) anemia ,   Anesthesia Other Findings   Reproductive/Obstetrics (+) Pregnancy                           Anesthesia Physical Anesthesia Plan  ASA: II  Anesthesia Plan: Epidural   Post-op Pain Management:    Induction:   Airway Management Planned:   Additional Equipment:   Intra-op Plan:   Post-operative Plan:   Informed Consent: I have reviewed the patients History and Physical, chart, labs and discussed the procedure including the risks, benefits and alternatives for the proposed anesthesia with the patient or authorized representative who has indicated his/her understanding and acceptance.     Plan Discussed with: Anesthesiologist  Anesthesia Plan Comments:         Anesthesia Quick Evaluation

## 2012-08-24 NOTE — Progress Notes (Signed)
  Patient ID: Annette Sullivan, female   DOB: Nov 03, 1985, 27 y.o.   MRN: [id number]  S: Pt uncomfortable and asking for epidural  Cervix 4-5/80/-2  FHTs:  135, mod var, accels present, no decels TOCO:  q 2 minutes  A/P IOL for GHTN, progressing on pitocin Epidural Continue to increase pitocin BP controlled. Anticipate SVD  Napoleon Form, MD

## 2012-08-24 NOTE — H&P (Signed)
Annette Sullivan is a 27 y.o. G3P1011 at [redacted]w[redacted]d who presents for IOL due to gestational hypertension with worsening blood pressure [facility] Pt denies headache, vision changed, RUQ pain. Occasional mild-moderate contractions, no bleeding or LOF. Cervix was 4-5 cm at prenatal visit.  Maternal Medical History:  Reason for admission: Reason for Admission:   nauseaContractions: Onset was more than 2 days ago.   Frequency: irregular.   Perceived severity is moderate.    Fetal activity: Perceived fetal activity is normal.   Last perceived fetal movement was within the past hour.    Prenatal complications: Hypertension (Not on medications but has had elevated pressures last few visits).   Prenatal Complications - Diabetes: none.    OB History    Grav Para Term Preterm Abortions TAB SAB Ect Mult Living   3 1 1  1 1    1      Past Medical History  Diagnosis Date  . No pertinent past medical history   . Headache   . Anemia   . Obesity (BMI 30-39.9)   . Pregnancy induced hypertension    Past Surgical History  Procedure Date  . Appendectomy    Family History: family history is negative for Anesthesia problems. Social History:  reports that she has never smoked. She has never used smokeless tobacco. She reports that she does not drink alcohol or use illicit drugs.   Prenatal Transfer Tool  Maternal Diabetes: No Genetic Screening: Normal Maternal Ultrasounds/Referrals: Normal Fetal Ultrasounds or other Referrals:  None Maternal Substance Abuse:  No Significant Maternal Medications:  None Significant Maternal Lab Results:  None Other Comments:  None  Review of Systems  Constitutional: Negative for fever, chills and diaphoresis.  Eyes: Negative for blurred vision and double vision.  Respiratory: Negative for shortness of breath.   Cardiovascular: Negative for chest pain.  Gastrointestinal: Negative for  nausea, vomiting, abdominal pain, diarrhea and constipation.  Genitourinary: Negative for dysuria.  Neurological: Negative for dizziness and headaches.      Blood pressure 135/85, pulse 87, temperature 98.1 F (36.7 C), temperature source Oral, resp. rate 20, last menstrual period 10/25/2011. Maternal Exam:  Uterine Assessment: Contraction strength is mild.  Contraction frequency is irregular.   Abdomen: Fetal presentation: vertex  Introitus: Normal vulva. Normal vagina.  Vagina is negative for discharge.  Pelvis: adequate for delivery.   Cervix: Cervix evaluated by digital exam.     Physical Exam  Constitutional: She is oriented to person, place, and time. She appears well-developed and well-nourished. No distress.  HENT:  Head: Normocephalic and atraumatic.  Eyes: Conjunctivae normal and EOM are normal.  Neck: Normal range of motion. Neck supple.  Cardiovascular: Normal rate, regular rhythm and normal heart sounds.   Respiratory: Effort normal and breath sounds normal. No respiratory distress. She has no wheezes.  GI: Bowel sounds are normal. There is no tenderness. There is no rebound and no guarding.  Genitourinary: Vagina normal. No vaginal discharge found.  Musculoskeletal: Normal range of motion. She exhibits no edema and no tenderness.  Neurological: She is alert and oriented to person, place, and time. She has normal reflexes.  Skin: Skin is warm and dry.  Psychiatric: She has a normal mood and affect.     Cervix 4/50/-2, mid, soft  Results for orders placed during the hospital encounter of 08/24/12 (from the past 24 hour(s))  CBC     Status: Abnormal   Collection  Time   08/24/12  3:00 PM      Component Value Range   WBC 5.8  4.0 - 10.5 K/uL   RBC 3.96  3.87 - 5.11 MIL/uL   Hemoglobin 11.2 (*) 12.0 - 15.0 g/dL   HCT 13.0 (*) 86.5 - 78.4 %   MCV 86.4  78.0 - 100.0 fL   MCH 28.3  26.0 - 34.0 pg   MCHC 32.7  30.0 - 36.0 g/dL   RDW 69.6  29.5 - 28.4 %   Platelets  188  150 - 400 K/uL  COMPREHENSIVE METABOLIC PANEL     Status: Abnormal   Collection Time   08/24/12  3:00 PM      Component Value Range   Sodium 135  135 - 145 mEq/L   Potassium 4.3  3.5 - 5.1 mEq/L   Chloride 100  96 - 112 mEq/L   CO2 22  19 - 32 mEq/L   Glucose, Bld 107 (*) 70 - 99 mg/dL   BUN 6  6 - 23 mg/dL   Creatinine, Ser 1.32  0.50 - 1.10 mg/dL   Calcium 9.6  8.4 - 44.0 mg/dL   Total Protein 6.1  6.0 - 8.3 g/dL   Albumin 2.7 (*) 3.5 - 5.2 g/dL   AST 21  0 - 37 U/L   ALT 13  0 - 35 U/L   Alkaline Phosphatase 148 (*) 39 - 117 U/L   Total Bilirubin 0.4  0.3 - 1.2 mg/dL   GFR calc non Af Amer >90  >90 mL/min   GFR calc Af Amer >90  >90 mL/min    Prenatal labs: ABO, Rh: A/Positive/-- (08/07 0000) Antibody:  neg Rubella: Immune (08/07 0000) RPR: Nonreactive (08/07 0000)  HBsAg: Negative (08/07 0000)  HIV: Non-reactive (08/07 0000)  GBS: Negative (01/04 0000)  1 hr GTT 96  Assessment/Plan:  27 y.o. G3P1011 at [redacted]w[redacted]d with Gestational Hypertension - Urine protein/creatinine ratio pending - Asymptomatic - No mag for now. Monitor BP and treat if >160/110 - Pitocin - GBS negative  Napoleon Form, MD 08/24/2012 4:26 PM

## 2012-08-25 NOTE — Progress Notes (Signed)
UR completed 

## 2012-08-25 NOTE — Progress Notes (Signed)
Post Partum Day 1 Subjective: up ad lib, voiding, tolerating PO and mild pain esp when walking  Objective: Blood pressure 133/83, pulse 83, temperature 97.2 F (36.2 C), temperature source Oral, resp. rate 20, height 5\' 5"  (1.651 m), weight 90.855 kg (200 lb 4.8 oz), last menstrual period 10/25/2011, SpO2 98.00%, unknown if currently breastfeeding.  Physical Exam:  General: alert, cooperative and no distress Lochia: appropriate Uterine Fundus: firm Incision: n/a DVT Evaluation: No evidence of DVT seen on physical exam. Negative Homan's sign. No cords or calf tenderness. No significant calf/ankle edema.   Basename 08/24/12 1500  HGB 11.2*  HCT 34.2*    Assessment/Plan: Plan for discharge tomorrow, Breastfeeding and Contraception -plans Depo   LOS: 1 day   Napoleon Form 08/25/2012, 7:27 AM

## 2012-08-25 NOTE — Anesthesia Postprocedure Evaluation (Signed)
Anesthesia Post Note  Patient: Annette Sullivan  Procedure(s) Performed: * No procedures listed *  Anesthesia type: Epidural  Patient location: Mother/Baby  Post pain: Pain level controlled  Post assessment: Post-op Vital signs reviewed  Last Vitals:  Filed Vitals:   08/25/12 0047  BP: 133/83  Pulse: 83  Temp: 36.2 C  Resp: 20    Post vital signs: Reviewed  Level of consciousness:alert  Complications: No apparent anesthesia complications

## 2012-08-26 MED ORDER — IBUPROFEN 600 MG PO TABS: 600.0000 mg | ORAL_TABLET | Freq: Four times a day (QID) | ORAL | Status: DC | PRN

## 2012-08-26 NOTE — Discharge Summary (Signed)
Obstetric Discharge Summary Reason for Admission: induction of labor Prenatal Procedures: none Intrapartum Procedures: spontaneous vaginal delivery Postpartum Procedures: none Complications-Operative and Postpartum: second degree perineal laceration  Patient admitted for induction of labor at 40w5 days secondary to gestational hypertension. All laboratory values were normal and patient was not on magnesium sulfate. Induction of labor was initiated with pitocin as cervix was already dilated 4-5 cm. She delivered a viable female infant with Apgars 9 and 9 and weight 7lb 11oz. Postpartum course remains uneventful. Hemoglobin  Date Value Range Status  08/24/2012 11.2* 12.0 - 15.0 g/dL Final     HCT  Date Value Range Status  08/24/2012 34.2* 36.0 - 46.0 % Final    Physical Exam:  Filed Vitals:   08/26/12 0559  BP: 132/83  Pulse: 72  Temp: 98.4 F (36.9 C)  Resp: 18   General: alert, cooperative and no distress Lochia: appropriate Uterine Fundus: firm, NT Incision: n/a DVT Evaluation: No cords or calf tenderness. No significant calf/ankle edema.  Discharge Diagnoses: Term Pregnancy-delivered  Discharge Information: Date: 08/26/2012 Activity: pelvic rest Diet: routine Medications: PNV and Ibuprofen Condition: stable Instructions: refer to practice specific booklet Discharge to: home   Newborn Data: Live born female  Birth Weight: 7 lb 11 oz (3487 g) APGAR: 9, 9  Home with mother.  Amberly Livas 08/26/2012, 7:40 AM

## 2012-08-29 ENCOUNTER — Inpatient Hospital Stay (HOSPITAL_COMMUNITY): Admission: RE | Admit: 2012-08-29 | Payer: 59 | Source: Ambulatory Visit

## 2014-02-16 ENCOUNTER — Emergency Department (HOSPITAL_COMMUNITY): Payer: 59

## 2014-02-16 ENCOUNTER — Emergency Department (HOSPITAL_COMMUNITY)
Admission: EM | Admit: 2014-02-16 | Discharge: 2014-02-16 | Disposition: A | Discharge status: Home / Self Care | Payer: 59 | Attending: Emergency Medicine | Admitting: Emergency Medicine

## 2014-02-16 ENCOUNTER — Encounter (HOSPITAL_COMMUNITY): Payer: Self-pay | Admitting: Emergency Medicine

## 2014-02-16 DIAGNOSIS — Y9389 Activity, other specified: Secondary | ICD-10-CM | POA: Insufficient documentation

## 2014-02-16 DIAGNOSIS — G5600 Carpal tunnel syndrome, unspecified upper limb: Secondary | ICD-10-CM | POA: Diagnosis not present

## 2014-02-16 DIAGNOSIS — Z79899 Other long term (current) drug therapy: Secondary | ICD-10-CM | POA: Insufficient documentation

## 2014-02-16 DIAGNOSIS — E669 Obesity, unspecified: Secondary | ICD-10-CM | POA: Diagnosis not present

## 2014-02-16 DIAGNOSIS — S59909A Unspecified injury of unspecified elbow, initial encounter: Secondary | ICD-10-CM | POA: Insufficient documentation

## 2014-02-16 DIAGNOSIS — X503XXA Overexertion from repetitive movements, initial encounter: Secondary | ICD-10-CM | POA: Diagnosis not present

## 2014-02-16 DIAGNOSIS — Z862 Personal history of diseases of the blood and blood-forming organs and certain disorders involving the immune mechanism: Secondary | ICD-10-CM | POA: Diagnosis not present

## 2014-02-16 DIAGNOSIS — Y9289 Other specified places as the place of occurrence of the external cause: Secondary | ICD-10-CM | POA: Diagnosis not present

## 2014-02-16 DIAGNOSIS — G5601 Carpal tunnel syndrome, right upper limb: Secondary | ICD-10-CM

## 2014-02-16 DIAGNOSIS — X58XXXA Exposure to other specified factors, initial encounter: Secondary | ICD-10-CM

## 2014-02-16 DIAGNOSIS — S59919A Unspecified injury of unspecified forearm, initial encounter: Secondary | ICD-10-CM

## 2014-02-16 DIAGNOSIS — S6990XA Unspecified injury of unspecified wrist, hand and finger(s), initial encounter: Secondary | ICD-10-CM

## 2014-02-16 MED ORDER — IBUPROFEN 800 MG PO TABS: 800.0000 mg | ORAL_TABLET | Freq: Three times a day (TID) | ORAL | Status: DC

## 2014-02-16 MED ORDER — PREDNISONE 20 MG PO TABS: 40.0000 mg | ORAL_TABLET | Freq: Every day | ORAL | Status: DC

## 2014-02-16 MED ORDER — PREDNISONE 20 MG PO TABS
60.0000 mg | ORAL_TABLET | Freq: Once | ORAL | Status: AC
  Administered 2014-02-16: 60 mg via ORAL
  Filled 2014-02-16: qty 3

## 2014-02-16 MED ORDER — IBUPROFEN 400 MG PO TABS
800.0000 mg | ORAL_TABLET | Freq: Once | ORAL | Status: AC
  Administered 2014-02-16: 800 mg via ORAL
  Filled 2014-02-16: qty 2

## 2014-02-16 NOTE — Discharge Instructions (Signed)
Please follow up with your primary care physician in 1-2 days. If you do not have one please call the [facility] number listed above. Please follow up with Dr. Janee Morn, if symptoms do not improve with conservative measures discussed, to schedule a follow up appointment. Please take prescribed medications as advised. Please read all discharge instructions and return precautions.    Carpal Tunnel Syndrome The carpal tunnel is a narrow area located on the palm side of your wrist. The tunnel is formed by the wrist bones and ligaments. Nerves, blood vessels, and tendons pass through the carpal tunnel. Repeated wrist motion or certain diseases may cause swelling within the tunnel. This swelling pinches the main nerve in the wrist (median nerve) and causes the painful hand and arm condition called carpal tunnel syndrome. CAUSES   Repeated wrist motions.  Wrist injuries.  Certain diseases like arthritis, diabetes, alcoholism, hyperthyroidism, and kidney failure.  Obesity.  Pregnancy. SYMPTOMS   A "pins and needles" feeling in your fingers or hand, especially in your thumb, index and middle fingers.  Tingling or numbness in your fingers or hand.  An aching feeling in your entire arm, especially when your wrist and elbow are bent for long periods of time.  Wrist pain that goes up your arm to your shoulder.  Pain that goes down into your palm or fingers.  A weak feeling in your hands. DIAGNOSIS  Your health care provider will take your history and perform a physical exam. An electromyography test may be needed. This test measures electrical signals sent out by your nerves into the muscles. The electrical signals are usually slowed by carpal tunnel syndrome. You may also need X-rays. TREATMENT  Carpal tunnel syndrome may clear up by itself. Your health care provider may recommend a wrist splint or medicine such as a nonsteroidal anti-inflammatory medicine. Cortisone  injections may help. Sometimes, surgery may be needed to free the pinched nerve.  HOME CARE INSTRUCTIONS   Take all medicine as directed by your health care provider. Only take over-the-counter or prescription medicines for pain, discomfort, or fever as directed by your health care provider.  If you were given a splint to keep your wrist from bending, wear it as directed. It is important to wear the splint at night. Wear the splint for as long as you have pain or numbness in your hand, arm, or wrist. This may take 1 to 2 months.  Rest your wrist from any activity that may be causing your pain. If your symptoms are work-related, you may need to talk to your employer about changing to a job that does not require using your wrist.  Put ice on your wrist after long periods of wrist activity.  Put ice in a plastic bag.  Place a towel between your skin and the bag.  Leave the ice on for 15-20 minutes, 03-04 times a day.  Keep all follow-up visits as directed by your health care provider. This includes any orthopedic referrals, physical therapy, and rehabilitation. Any delay in getting necessary care could result in a delay or failure of your condition to heal. SEEK IMMEDIATE MEDICAL CARE IF:   You have new, unexplained symptoms.  Your symptoms get worse and are not helped or controlled with medicines. MAKE SURE YOU:   Understand these instructions.  Will watch your condition.  Will get help right away if you are not doing well or get worse. Document Released: 07/09/2000 Document Revised: 11/26/2013 Document Reviewed: 05/28/2011 ExitCare Patient  Information 2015 [employer], [place]. This information is not intended to replace advice given to you by your health care provider. Make sure you discuss any questions you have with your health care provider. RICE: Routine Care for Injuries The routine care of many injuries includes Rest, Ice, Compression, and Elevation (RICE). HOME CARE  INSTRUCTIONS  Rest is needed to allow your body to heal. Routine activities can usually be resumed when comfortable. Injured tendons and bones can take up to 6 weeks to heal. Tendons are the cord-like structures that attach muscle to bone.  Ice following an injury helps keep the swelling down and reduces pain.  Put ice in a plastic bag.  Place a towel between your skin and the bag.  Leave the ice on for 15-20 minutes, 3-4 times a day, or as directed by your health care provider. Do this while awake, for the first 24 to 48 hours. After that, continue as directed by your caregiver.  Compression helps keep swelling down. It also gives support and helps with discomfort. If an elastic bandage has been applied, it should be removed and reapplied every 3 to 4 hours. It should not be applied tightly, but firmly enough to keep swelling down. Watch fingers or toes for swelling, bluish discoloration, coldness, numbness, or excessive pain. If any of these problems occur, remove the bandage and reapply loosely. Contact your caregiver if these problems continue.  Elevation helps reduce swelling and decreases pain. With extremities, such as the arms, hands, legs, and feet, the injured area should be placed near or above the level of the heart, if possible. SEEK IMMEDIATE MEDICAL CARE IF:  You have persistent pain and swelling.  You develop redness, numbness, or unexpected weakness.  Your symptoms are getting worse rather than improving after several days. These symptoms may indicate that further evaluation or further X-rays are needed. Sometimes, X-rays may not show a small broken bone (fracture) until 1 week or 10 days later. Make a follow-up appointment with your caregiver. Ask when your X-ray results will be ready. Make sure you get your X-ray results. Document Released: 10/24/2000 Document Revised: 07/17/2013 Document Reviewed: 12/11/2010 [facility] Patient Information 2015 [place], [place]. This  information is not intended to replace advice given to you by your health care provider. Make sure you discuss any questions you have with your health care provider.

## 2014-02-16 NOTE — ED Notes (Signed)
Pt reports Rt arm pain started 2 weeks ago.

## 2014-02-16 NOTE — ED Provider Notes (Signed)
CSN: [id number]     Arrival date & time 02/16/14  4098 History   None    Chief Complaint  Patient presents with  . Arm Pain    RT     (Consider location/radiation/quality/duration/timing/severity/associated sxs/prior Treatment) HPI Comments: Patient is a 28 year old female past medical history significant for headaches, anemia, obesity presenting to the emergency department for right forearm pain that started 2 weeks ago. Patient states it is constant severe throbbing burning in nature with radiation to hand and up to his shoulder. Patient endorses burning tingling sensation in thumb index and middle finger on right hand. Endorses repetitive motions at work. Attempts to take Motrin and Tylenol with little to no improvement of symptoms. No history of right arm injuries or surgeries in the past. Patient is right-hand dominant.   Past Medical History  Diagnosis Date  . No pertinent past medical history   . Headache(784.0)   . Anemia   . Obesity (BMI 30-39.9)   . Pregnancy induced hypertension    Past Surgical History  Procedure Laterality Date  . Appendectomy     Family History  Problem Relation Age of Onset  . Anesthesia problems Neg Hx    History  Substance Use Topics  . Smoking status: Never Smoker   . Smokeless tobacco: Never Used  . Alcohol Use: No   OB History   Grav Para Term Preterm Abortions TAB SAB Ect Mult Living   3 2 2  0 1 1 0 0 0 2     Review of Systems  Constitutional: Negative for fever and chills.  Musculoskeletal: Positive for arthralgias and myalgias.  All other systems reviewed and are negative.     Allergies  Review of patient's allergies indicates no known allergies.  Home Medications   Prior to Admission medications   Medication Sig Start Date End Date Taking? Authorizing Provider  CALCIUM PO Take 1 tablet by mouth daily.   Yes Historical Provider, MD  ibuprofen (ADVIL,MOTRIN) 200 MG tablet Take 400 mg by mouth every 6 (six) hours as needed  (for pain).   Yes Historical Provider, MD  ibuprofen (ADVIL,MOTRIN) 800 MG tablet Take 1 tablet (800 mg total) by mouth 3 (three) times daily. 02/16/14   Shareta Fishbaugh L Jadavion Spoelstra, PA-C  predniSONE (DELTASONE) 20 MG tablet Take 2 tablets (40 mg total) by mouth daily. 02/16/14   Brindy Higginbotham L Malekai Markwood, PA-C   BP 126/77  Pulse 70  Temp(Src) 98.6 F (37 C) (Oral)  Resp 16  SpO2 100% Physical Exam  Nursing note and vitals reviewed. Constitutional: She is oriented to person, place, and time. She appears well-developed and well-nourished. No distress.  HENT:  Head: Normocephalic and atraumatic.  Right Ear: External ear normal.  Left Ear: External ear normal.  Nose: Nose normal.  Mouth/Throat: Oropharynx is clear and moist.  Eyes: Conjunctivae are normal.  Neck: Normal range of motion. Neck supple.  Cardiovascular: Normal rate, regular rhythm, normal heart sounds and intact distal pulses.   Pulmonary/Chest: Effort normal and breath sounds normal.  Abdominal: Soft.  Musculoskeletal: Normal range of motion.       Right wrist: She exhibits normal range of motion, no swelling and no deformity.       Left wrist: Normal.       Right hand: Normal.       Left hand: Normal.  Positive Phalen and Tinel sign - right hand. Negative snuffbox tenderness.   Neurological: She is alert and oriented to person, place, and time.  Skin: Skin  is warm and dry. She is not diaphoretic.  Psychiatric: She has a normal mood and affect.    ED Course  Procedures (including critical care time) Medications  ibuprofen (ADVIL,MOTRIN) tablet 800 mg (800 mg Oral Given 02/16/14 0856)  predniSONE (DELTASONE) tablet 60 mg (60 mg Oral Given 02/16/14 0856)    Labs Review Labs Reviewed - No data to display  Imaging Review Dg Forearm Right  02/16/2014   CLINICAL DATA:  Right forearm pain.  EXAM: RIGHT FOREARM - 2 VIEW  COMPARISON:  None.  FINDINGS: The wrist and elbow joints are maintained. No acute bony findings involving  the forearm.  IMPRESSION: No acute bony findings.   Electronically Signed   By: Loralie ChampagneMark  Gallerani M.D.   On: 02/16/2014 08:30     EKG Interpretation None      SPLINT APPLICATION Date/Time: 8:57 AM Authorized by: Francee PiccoloPIEPENBRINK, Tevyn Codd L Consent: Verbal consent obtained. Risks and benefits: risks, benefits and alternatives were discussed Consent given by: patient Splint applied by: orthopedic technician Location details: right wrist Splint type: thumb spica Supplies used: velcro Post-procedure: The splinted body part was neurovascularly unchanged following the procedure. Patient tolerance: Patient tolerated the procedure well with no immediate complications.    MDM   Final diagnoses:  Carpal tunnel syndrome of right wrist    Filed Vitals:   02/16/14 0716  BP: 126/77  Pulse: 70  Temp: 98.6 F (37 C)  Resp: 16   Afebrile, NAD, non-toxic appearing, AAOx4.  Neurovascularly intact. Normal sensation.X-ray reviewed. Symptoms consistent with carpal tunnel syndrome. Will apply a Velcro thumb spica and provide anti-inflammatories. Conservative measures discussed with patient. Discussion may need to see a hand surgeon future for further management of her carpal tunnel. Return precautions discussed. Patient is agreeable to plan. Patient is stable to discharge.     Jeannetta EllisJennifer L Calisa Luckenbaugh, PA-C 02/16/14 [phone]

## 2014-02-16 NOTE — ED Notes (Signed)
Declined W/C at D/C and was escorted to lobby by RN. 

## 2014-02-16 NOTE — Progress Notes (Signed)
Orthopedic Tech Progress Note Patient Details:  Annette RobinsonMary A Sullivan 1986/06/26 [id number]  Ortho Devices Type of Ortho Device: Thumb velcro splint Ortho Device/Splint Interventions: Application   Cammer, Mickie BailJennifer Carol 02/16/2014, 1:02 PM

## 2014-02-16 NOTE — ED Provider Notes (Signed)
Medical screening examination/treatment/procedure(s) were performed by non-physician practitioner and as supervising physician I was immediately available for consultation/collaboration.   EKG Interpretation None        Tanise Russman W Tashae Inda, MD 02/16/14 1559 

## 2014-05-27 ENCOUNTER — Encounter (HOSPITAL_COMMUNITY): Payer: Self-pay | Admitting: Emergency Medicine

## 2015-05-13 DIAGNOSIS — K429 Umbilical hernia without obstruction or gangrene: Secondary | ICD-10-CM

## 2015-05-13 HISTORY — DX: Umbilical hernia without obstruction or gangrene: K42.9

## 2015-05-17 ENCOUNTER — Emergency Department (HOSPITAL_COMMUNITY)
Admission: EM | Admit: 2015-05-17 | Discharge: 2015-05-17 | Disposition: A | Discharge status: Home / Self Care | Payer: 59 | Attending: Emergency Medicine | Admitting: Emergency Medicine

## 2015-05-17 ENCOUNTER — Emergency Department (HOSPITAL_COMMUNITY): Payer: 59

## 2015-05-17 ENCOUNTER — Encounter (HOSPITAL_COMMUNITY): Payer: Self-pay

## 2015-05-17 DIAGNOSIS — Z3202 Encounter for pregnancy test, result negative: Secondary | ICD-10-CM | POA: Diagnosis not present

## 2015-05-17 DIAGNOSIS — F410 Panic disorder [episodic paroxysmal anxiety] without agoraphobia: Secondary | ICD-10-CM | POA: Insufficient documentation

## 2015-05-17 DIAGNOSIS — Z6839 Body mass index (BMI) 39.0-39.9, adult: Secondary | ICD-10-CM | POA: Diagnosis not present

## 2015-05-17 DIAGNOSIS — Z862 Personal history of diseases of the blood and blood-forming organs and certain disorders involving the immune mechanism: Secondary | ICD-10-CM | POA: Diagnosis not present

## 2015-05-17 DIAGNOSIS — R0789 Other chest pain: Secondary | ICD-10-CM | POA: Diagnosis not present

## 2015-05-17 DIAGNOSIS — E669 Obesity, unspecified: Secondary | ICD-10-CM | POA: Insufficient documentation

## 2015-05-17 DIAGNOSIS — R112 Nausea with vomiting, unspecified: Secondary | ICD-10-CM | POA: Insufficient documentation

## 2015-05-17 DIAGNOSIS — R079 Chest pain, unspecified: Secondary | ICD-10-CM | POA: Diagnosis present

## 2015-05-17 LAB — BASIC METABOLIC PANEL
Anion gap: 12 (ref 5–15)
BUN: 10 mg/dL (ref 6–20)
CO2: 22 mmol/L (ref 22–32)
CREATININE: 0.74 mg/dL (ref 0.44–1.00)
Calcium: 10 mg/dL (ref 8.9–10.3)
Chloride: 103 mmol/L (ref 101–111)
GFR calc Af Amer: >60 mL/min (ref >60)
Glucose, Bld: 104 mg/dL — ABNORMAL HIGH (ref 65–99)
Potassium: 3.2 mmol/L — ABNORMAL LOW (ref 3.5–5.1)
SODIUM: 137 mmol/L (ref 135–145)

## 2015-05-17 LAB — CBC
HCT: 40 % (ref 36.0–46.0)
Hemoglobin: 13.4 g/dL (ref 12.0–15.0)
MCH: 27.9 pg (ref 26.0–34.0)
MCHC: 33.5 g/dL (ref 30.0–36.0)
MCV: 83.3 fL (ref 78.0–100.0)
PLATELETS: 228 10*3/uL (ref 150–400)
RBC: 4.8 MIL/uL (ref 3.87–5.11)
RDW: 12.2 % (ref 11.5–15.5)
WBC: 6 10*3/uL (ref 4.0–10.5)

## 2015-05-17 LAB — I-STAT TROPONIN, ED
Troponin i, poc: 0 ng/mL (ref 0.00–0.08)
Troponin i, poc: 0 ng/mL (ref 0.00–0.08)

## 2015-05-17 LAB — I-STAT BETA HCG BLOOD, ED (MC, WL, AP ONLY): I-stat hCG, quantitative: <5 m[IU]/mL (ref <5)

## 2015-05-17 LAB — D-DIMER, QUANTITATIVE (NOT AT ARMC): D DIMER QUANT: 0.34 ug{FEU}/mL (ref 0.00–0.48)

## 2015-05-17 MED ORDER — SODIUM CHLORIDE 0.9 % IV BOLUS (SEPSIS)
1000.0000 mL | Freq: Once | INTRAVENOUS | Status: AC
  Administered 2015-05-17: 1000 mL via INTRAVENOUS

## 2015-05-17 MED ORDER — ONDANSETRON HCL 4 MG/2ML IJ SOLN
4.0000 mg | Freq: Once | INTRAMUSCULAR | Status: AC
  Administered 2015-05-17: 4 mg via INTRAVENOUS
  Filled 2015-05-17: qty 2

## 2015-05-17 MED ORDER — LORAZEPAM 1 MG PO TABS
1.0000 mg | ORAL_TABLET | Freq: Once | ORAL | Status: AC
  Administered 2015-05-17: 1 mg via ORAL
  Filled 2015-05-17: qty 1

## 2015-05-17 MED ORDER — ALPRAZOLAM 0.25 MG PO TABS: 0.2500 mg | ORAL_TABLET | Freq: Every evening | ORAL | Status: DC | PRN

## 2015-05-17 NOTE — Discharge Instructions (Signed)
Nonspecific Chest Pain  °Chest pain can be caused by many different conditions. There is always a chance that your pain could be related to something serious, such as a heart attack or a blood clot in your lungs. Chest pain can also be caused by conditions that are not life-threatening. If you have chest pain, it is very important to follow up with your health care provider. °CAUSES  °Chest pain can be caused by: °· Heartburn. °· Pneumonia or bronchitis. °· Anxiety or stress. °· Inflammation around your heart (pericarditis) or lung (pleuritis or pleurisy). °· A blood clot in your lung. °· A collapsed lung (pneumothorax). It can develop suddenly on its own (spontaneous pneumothorax) or from trauma to the chest. °· Shingles infection (varicella-zoster virus). °· Heart attack. °· Damage to the bones, muscles, and cartilage that make up your chest wall. This can include: °· Bruised bones due to injury. °· Strained muscles or cartilage due to frequent or repeated coughing or overwork. °· Fracture to one or more ribs. °· Sore cartilage due to inflammation (costochondritis). °RISK FACTORS  °Risk factors for chest pain may include: °· Activities that increase your risk for trauma or injury to your chest. °· Respiratory infections or conditions that cause frequent coughing. °· Medical conditions or overeating that can cause heartburn. °· Heart disease or family history of heart disease. °· Conditions or health behaviors that increase your risk of developing a blood clot. °· Having had chicken pox (varicella zoster). °SIGNS AND SYMPTOMS °Chest pain can feel like: °· Burning or tingling on the surface of your chest or deep in your chest. °· Crushing, pressure, aching, or squeezing pain. °· Dull or sharp pain that is worse when you move, cough, or take a deep breath. °· Pain that is also felt in your back, neck, shoulder, or arm, or pain that spreads to any of these areas. °Your chest pain may come and go, or it may stay  constant. °DIAGNOSIS °Lab tests or other studies may be needed to find the cause of your pain. Your health care provider may have you take a test called an ambulatory ECG (electrocardiogram). An ECG records your heartbeat patterns at the time the test is performed. You may also have other tests, such as: °· Transthoracic echocardiogram (TTE). During echocardiography, sound waves are used to create a picture of all of the heart structures and to look at how blood flows through your heart. °· Transesophageal echocardiogram (TEE). This is a more advanced imaging test that obtains images from inside your body. It allows your health care provider to see your heart in finer detail. °· Cardiac monitoring. This allows your health care provider to monitor your heart rate and rhythm in real time. °· Holter monitor. This is a portable device that records your heartbeat and can help to diagnose abnormal heartbeats. It allows your health care provider to track your heart activity for several days, if needed. °· Stress tests. These can be done through exercise or by taking medicine that makes your heart beat more quickly. °· Blood tests. °· Imaging tests. °TREATMENT  °Your treatment depends on what is causing your chest pain. Treatment may include: °· Medicines. These may include: °· Acid blockers for heartburn. °· Anti-inflammatory medicine. °· Pain medicine for inflammatory conditions. °· Antibiotic medicine, if an infection is present. °· Medicines to dissolve blood clots. °· Medicines to treat coronary artery disease. °· Supportive care for conditions that do not require medicines. This may include: °· Resting. °· Applying heat   or cold packs to injured areas. °· Limiting activities until pain decreases. °HOME CARE INSTRUCTIONS °· If you were prescribed an antibiotic medicine, finish it all even if you start to feel better. °· Avoid any activities that bring on chest pain. °· Do not use any tobacco products, including  cigarettes, chewing tobacco, or electronic cigarettes. If you need help quitting, ask your health care provider. °· Do not drink alcohol. °· Take medicines only as directed by your health care provider. °· Keep all follow-up visits as directed by your health care provider. This is important. This includes any further testing if your chest pain does not go away. °· If heartburn is the cause for your chest pain, you may be told to keep your head raised (elevated) while sleeping. This reduces the chance that acid will go from your stomach into your esophagus. °· Make lifestyle changes as directed by your health care provider. These may include: °· Getting regular exercise. Ask your health care provider to suggest some activities that are safe for you. °· Eating a heart-healthy diet. A registered dietitian can help you to learn healthy eating options. °· Maintaining a healthy weight. °· Managing diabetes, if necessary. °· Reducing stress. °SEEK MEDICAL CARE IF: °· Your chest pain does not go away after treatment. °· You have a rash with blisters on your chest. °· You have a fever. °SEEK IMMEDIATE MEDICAL CARE IF:  °· Your chest pain is worse. °· You have an increasing cough, or you cough up blood. °· You have severe abdominal pain. °· You have severe weakness. °· You faint. °· You have chills. °· You have sudden, unexplained chest discomfort. °· You have sudden, unexplained discomfort in your arms, back, neck, or jaw. °· You have shortness of breath at any time. °· You suddenly start to sweat, or your skin gets clammy. °· You feel nauseous or you vomit. °· You suddenly feel light-headed or dizzy. °· Your heart begins to beat quickly, or it feels like it is skipping beats. °These symptoms may represent a serious problem that is an emergency. Do not wait to see if the symptoms will go away. Get medical help right away. Call your local emergency services (911 in the [place] Do not drive yourself to the hospital. °  °This  information is not intended to replace advice given to you by your health care provider. Make sure you discuss any questions you have with your health care provider. °  °Document Released: 04/21/2005 Document Revised: 08/02/2014 Document Reviewed: 02/15/2014 °Elsevier Interactive Patient Education ©2016 [employer] ° °Panic Attacks °Panic attacks are sudden, short-lived surges of severe anxiety, fear, or discomfort. They may occur for no reason when you are relaxed, when you are anxious, or when you are sleeping. Panic attacks may occur for a number of reasons:  °· Healthy people occasionally have panic attacks in extreme, life-threatening situations, such as war or natural disasters. Normal anxiety is a protective mechanism of the body that helps us react to danger (fight or flight response). °· Panic attacks are often seen with anxiety disorders, such as panic disorder, social anxiety disorder, generalized anxiety disorder, and phobias. Anxiety disorders cause excessive or uncontrollable anxiety. They may interfere with your relationships or other life activities. °· Panic attacks are sometimes seen with other mental illnesses, such as depression and posttraumatic stress disorder. °· Certain medical conditions, prescription medicines, and drugs of abuse can cause panic attacks. °SYMPTOMS  °Panic attacks start suddenly, peak within 20 minutes, and are   accompanied by four or more of the following symptoms: °· Pounding heart or fast heart rate (palpitations). °· Sweating. °· Trembling or shaking. °· Shortness of breath or feeling smothered. °· Feeling choked. °· Chest pain or discomfort. °· Nausea or strange feeling in your stomach. °· Dizziness, light-headedness, or feeling like you will faint. °· Chills or hot flushes. °· Numbness or tingling in your lips or hands and feet. °· Feeling that things are not real or feeling that you are not yourself. °· Fear of losing control or going crazy. °· Fear of dying. °Some  of these symptoms can mimic serious medical conditions. For example, you may think you are having a heart attack. Although panic attacks can be very scary, they are not life threatening. °DIAGNOSIS  °Panic attacks are diagnosed through an assessment by your health care provider. Your health care provider will ask questions about your symptoms, such as where and when they occurred. Your health care provider will also ask about your medical history and use of alcohol and drugs, including prescription medicines. Your health care provider may order blood tests or other studies to rule out a serious medical condition. Your health care provider may refer you to a mental health professional for further evaluation. °TREATMENT  °· Most healthy people who have one or two panic attacks in an extreme, life-threatening situation will not require treatment. °· The treatment for panic attacks associated with anxiety disorders or other mental illness typically involves counseling with a mental health professional, medicine, or a combination of both. Your health care provider will help determine what treatment is best for you. °· Panic attacks due to physical illness usually go away with treatment of the illness. If prescription medicine is causing panic attacks, talk with your health care provider about stopping the medicine, decreasing the dose, or substituting another medicine. °· Panic attacks due to alcohol or drug abuse go away with abstinence. Some adults need professional help in order to stop drinking or using drugs. °HOME CARE INSTRUCTIONS  °· Take all medicines as directed by your health care provider.   °· Schedule and attend follow-up visits as directed by your health care provider. It is important to keep all your appointments. °SEEK MEDICAL CARE IF: °· You are not able to take your medicines as prescribed. °· Your symptoms do not improve or get worse. °SEEK IMMEDIATE MEDICAL CARE IF:  °· You experience panic attack  symptoms that are different than your usual symptoms. °· You have serious thoughts about hurting yourself or others. °· You are taking medicine for panic attacks and have a serious side effect. °MAKE SURE YOU: °· Understand these instructions. °· Will watch your condition. °· Will get help right away if you are not doing well or get worse. °  °This information is not intended to replace advice given to you by your health care provider. Make sure you discuss any questions you have with your health care provider. °  °Document Released: 07/12/2005 Document Revised: 07/17/2013 Document Reviewed: 02/23/2013 °Elsevier Interactive Patient Education ©2016 [employer]. ° °

## 2015-05-17 NOTE — ED Notes (Signed)
Pt ambulatory with steady gait to restroom 

## 2015-05-17 NOTE — ED Notes (Signed)
Pt began feeling diaphoretic and short of breath with cp in center of chest around 0200 - awakened from sleep b/c of these symptoms. Pt denies any hx asthma, htn, etc. Pt tachypneic, tearful, appears anxious.

## 2015-05-17 NOTE — ED Provider Notes (Signed)
CSN: [id number]     Arrival date & time 05/17/15  0348 History   First MD Initiated Contact with Patient 05/17/15 0358     Chief Complaint  Patient presents with  . Shortness of Breath  . Chest Pain     (Consider location/radiation/quality/duration/timing/severity/associated sxs/prior Treatment) HPI Patient states she was feeling well this evening before going to sleep. She woke up at 2 AM with central chest pain, feeling hot and short of breath. She developed nausea and several episodes of vomiting. No previously similar symptoms. No cough or fever. Patient denies any lower extremity swelling or pain. No extended recent travel. Past Medical History  Diagnosis Date  . No pertinent past medical history   . Headache(784.0)   . Anemia   . Obesity (BMI 30-39.9)   . Pregnancy induced hypertension    Past Surgical History  Procedure Laterality Date  . Appendectomy     Family History  Problem Relation Age of Onset  . Anesthesia problems Neg Hx    Social History  Substance Use Topics  . Smoking status: Never Smoker   . Smokeless tobacco: Never Used  . Alcohol Use: No   OB History    Gravida Para Term Preterm AB TAB SAB Ectopic Multiple Living   3 2 2  0 1 1 0 0 0 2     Review of Systems  Constitutional: Negative for fever and chills.  Respiratory: Positive for shortness of breath. Negative for cough and wheezing.   Cardiovascular: Positive for chest pain. Negative for palpitations and leg swelling.  Gastrointestinal: Positive for nausea and vomiting. Negative for abdominal pain.  Musculoskeletal: Negative for back pain, neck pain and neck stiffness.  Skin: Negative for rash and wound.  Neurological: Negative for dizziness, weakness, light-headedness, numbness and headaches.  Psychiatric/Behavioral: The patient is nervous/anxious.   All other systems reviewed and are negative.     Allergies  Review of patient's allergies indicates no known allergies.  Home Medications    Prior to Admission medications   Medication Sig Start Date End Date Taking? Authorizing Provider  ALPRAZolam (XANAX) 0.25 MG tablet Take 1 tablet (0.25 mg total) by mouth at bedtime as needed for anxiety. 05/17/15   Loren Raceravid Tripton Ned, MD  ibuprofen (ADVIL,MOTRIN) 800 MG tablet Take 1 tablet (800 mg total) by mouth 3 (three) times daily. Patient not taking: Reported on 05/17/2015 02/16/14   Francee PiccoloJennifer Piepenbrink, PA-C  predniSONE (DELTASONE) 20 MG tablet Take 2 tablets (40 mg total) by mouth daily. Patient not taking: Reported on 05/17/2015 02/16/14   Victorino DikeJennifer Piepenbrink, PA-C   BP 130/67 mmHg  Pulse 72  Temp(Src) 98.4 F (36.9 C) (Oral)  Resp 21  SpO2 100% Physical Exam  Constitutional: She is oriented to person, place, and time. She appears well-developed and well-nourished. She appears distressed.  Very anxious appearing tearful.  HENT:  Head: Normocephalic and atraumatic.  Mouth/Throat: Oropharynx is clear and moist. No oropharyngeal exudate.  Eyes: EOM are normal. Pupils are equal, round, and reactive to light.  Neck: Normal range of motion. Neck supple.  Cardiovascular: Normal rate and regular rhythm.   Pulmonary/Chest: Effort normal and breath sounds normal. No respiratory distress. She has no wheezes. She has no rales. She exhibits no tenderness.  Hyperventilating  Abdominal: Soft. Bowel sounds are normal. She exhibits no distension and no mass. There is no tenderness. There is no rebound and no guarding.  Musculoskeletal: Normal range of motion. She exhibits no edema or tenderness.  No lower extremity swelling or pain  Neurological: She is alert and oriented to person, place, and time.  Moves all ext without deficit. Sensation is grossly intact.  Skin: Skin is warm and dry. No rash noted. No erythema.  Psychiatric: She has a normal mood and affect. Her behavior is normal.  Nursing note and vitals reviewed.   ED Course  Procedures (including critical care time) Labs  Review Labs Reviewed  BASIC METABOLIC PANEL - Abnormal; Notable for the following:    Potassium 3.2 (*)    Glucose, Bld 104 (*)    All other components within normal limits  CBC  D-DIMER, QUANTITATIVE (NOT AT [facility])  I-STAT BETA HCG BLOOD, ED ([facility], [facility], [facility] ONLY)  I-STAT TROPOININ, ED  I-STAT TROPOININ, ED    Imaging Review No results found. I have personally reviewed and evaluated these images and lab results as part of my medical decision-making.   EKG Interpretation   Date/Time:  Saturday May 17 2015 03:55:50 EDT Ventricular Rate:  86 PR Interval:  170 QRS Duration: 66 QT Interval:  356 QTC Calculation: 426 R Axis:   61 Text Interpretation:  Sinus rhythm Left atrial enlargement Borderline T  wave abnormalities Baseline wander in lead(s) I II aVR Confirmed by  Ranae Palms  MD, Brent Noto ([id number]) on 05/17/2015 4:36:04 AM      MDM   Final diagnoses:  Atypical chest pain  Panic attack   Patient is feeling much better after Ativan. Denies shortness of breath or chest pain at this time. Workup is essentially normal. I have low suspicion for coronary artery disease. EKG without any ischemic changes. Initial troponin is within normal limits. We'll repeat troponin but anticipate discharge home.  Repeat troponin is normal. Patient states she is feeling much better. Return precautions given.   Loren Racer, MD 05/20/15 [phone]

## 2015-05-20 ENCOUNTER — Other Ambulatory Visit: Payer: Self-pay | Admitting: Gastroenterology

## 2015-05-20 DIAGNOSIS — R1013 Epigastric pain: Secondary | ICD-10-CM

## 2015-05-20 DIAGNOSIS — R112 Nausea with vomiting, unspecified: Secondary | ICD-10-CM

## 2015-05-30 ENCOUNTER — Ambulatory Visit (HOSPITAL_COMMUNITY): Payer: 59

## 2015-05-30 ENCOUNTER — Encounter (HOSPITAL_COMMUNITY): Payer: 59 | Attending: Gastroenterology

## 2015-08-22 ENCOUNTER — Ambulatory Visit (HOSPITAL_COMMUNITY)
Admission: RE | Admit: 2015-08-22 | Discharge: 2015-08-22 | Disposition: A | Discharge status: Home / Self Care | Payer: 59 | Source: Ambulatory Visit | Attending: Gastroenterology | Admitting: Gastroenterology

## 2015-08-22 ENCOUNTER — Encounter (HOSPITAL_COMMUNITY)
Admission: RE | Admit: 2015-08-22 | Discharge: 2015-08-22 | Disposition: A | Discharge status: Home / Self Care | Payer: 59 | Source: Ambulatory Visit | Attending: Gastroenterology | Admitting: Gastroenterology

## 2015-08-22 DIAGNOSIS — R112 Nausea with vomiting, unspecified: Secondary | ICD-10-CM

## 2015-08-22 DIAGNOSIS — R1013 Epigastric pain: Secondary | ICD-10-CM

## 2015-08-22 MED ORDER — SINCALIDE 5 MCG IJ SOLR
1.8000 ug | Freq: Once | INTRAMUSCULAR | Status: AC
  Administered 2015-08-22: 1.8 ug via INTRAVENOUS

## 2015-08-22 MED ORDER — TECHNETIUM TC 99M MEBROFENIN IV KIT
5.3000 | PACK | Freq: Once | INTRAVENOUS | Status: AC | PRN
  Administered 2015-08-22: 5.3 via INTRAVENOUS

## 2015-10-06 ENCOUNTER — Other Ambulatory Visit: Payer: Self-pay | Admitting: Surgery

## 2015-12-11 ENCOUNTER — Encounter (HOSPITAL_COMMUNITY): Payer: Self-pay

## 2015-12-11 ENCOUNTER — Encounter (HOSPITAL_COMMUNITY)
Admission: RE | Admit: 2015-12-11 | Discharge: 2015-12-11 | Disposition: A | Discharge status: Home / Self Care | Payer: 59 | Source: Ambulatory Visit | Attending: Surgery | Admitting: Surgery

## 2015-12-11 DIAGNOSIS — K429 Umbilical hernia without obstruction or gangrene: Secondary | ICD-10-CM | POA: Insufficient documentation

## 2015-12-11 DIAGNOSIS — Z01812 Encounter for preprocedural laboratory examination: Secondary | ICD-10-CM | POA: Insufficient documentation

## 2015-12-11 LAB — BASIC METABOLIC PANEL
Anion gap: 11 (ref 5–15)
BUN: 13 mg/dL (ref 6–20)
CHLORIDE: 106 mmol/L (ref 101–111)
CO2: 23 mmol/L (ref 22–32)
CREATININE: 0.66 mg/dL (ref 0.44–1.00)
Calcium: 9.1 mg/dL (ref 8.9–10.3)
GFR calc non Af Amer: >60 mL/min (ref >60)
GLUCOSE: 110 mg/dL — AB (ref 65–99)
Potassium: 3.8 mmol/L (ref 3.5–5.1)
Sodium: 140 mmol/L (ref 135–145)

## 2015-12-11 LAB — HCG, SERUM, QUALITATIVE: PREG SERUM: NEGATIVE

## 2015-12-11 LAB — CBC
HCT: 37.2 % (ref 36.0–46.0)
Hemoglobin: 11.9 g/dL — ABNORMAL LOW (ref 12.0–15.0)
MCH: 26.6 pg (ref 26.0–34.0)
MCHC: 32 g/dL (ref 30.0–36.0)
MCV: 83.2 fL (ref 78.0–100.0)
Platelets: 258 10*3/uL (ref 150–400)
RBC: 4.47 MIL/uL (ref 3.87–5.11)
RDW: 12.5 % (ref 11.5–15.5)
WBC: 5.9 10*3/uL (ref 4.0–10.5)

## 2015-12-11 NOTE — Progress Notes (Signed)
Denies ever seeing a cardiologist. Denies having a PCP

## 2015-12-11 NOTE — Pre-Procedure Instructions (Signed)
Annette RobinsonMary A Sullivan  12/11/2015      [facility] [facility] [place] [address] [place] [id number] Phone: [phone] Fax: [phone]    Your procedure is scheduled on May 25.  [facility]  Call this number if you have problems the morning of surgery:  [phone]   Remember:  Do not eat food or drink liquids after midnight.  Take these medicines the morning of surgery with A SIP OF WATER Tylenol if needed   Stop taking asprin, Ibuprofen, Advil, Motrin, Herbal medications, Fish Oil, BC's, Goody's   Do not wear jewelry, make-up or nail polish.  Do not wear lotions, powders, or perfumes.  You may wear deodorant.  Do not shave 48 hours prior to surgery.  Men may shave face and neck.  Do not bring valuables to the hospital.  [facility] is not [facility] for any belongings or valuables.  Contacts, dentures or bridgework may not be worn into surgery.  Leave your suitcase in the car.  After surgery it may be brought to your room.  For patients admitted to the hospital, discharge time will be determined by your treatment team.  Patients discharged the day of surgery will not be allowed to drive home.    Special instructions:  [place] - Preparing for Surgery  Before surgery, you can play an important role.  Because skin is not sterile, your skin needs to be as free of germs as possible.  You can reduce the number of germs on you skin by washing with CHG (chlorahexidine gluconate) soap before surgery.  CHG is an antiseptic cleaner which kills germs and bonds with the skin to continue killing germs even after washing.  Please DO NOT use if you have an allergy to CHG or antibacterial soaps.  If your skin becomes reddened/irritated stop using the CHG and inform your nurse when you arrive at Short Stay.  Do not shave (including legs and underarms) for at least 48 hours prior to the  first CHG shower.  You may shave your face.  Please follow these instructions carefully:   1.  Shower with CHG Soap the night before surgery and the                                morning of Surgery.  2.  If you choose to wash your hair, wash your hair first as usual with your       normal shampoo.  3.  After you shampoo, rinse your hair and body thoroughly to remove the                      Shampoo.  4.  Use CHG as you would any other liquid soap.  You can apply chg directly       to the skin and wash gently with scrungie or a clean washcloth.  5.  Apply the CHG Soap to your body ONLY FROM THE NECK DOWN.        Do not use on open wounds or open sores.  Avoid contact with your eyes,       ears, mouth and genitals (private parts).  Wash genitals (private parts)       with your normal soap.  6.  Wash thoroughly, paying special attention to the area where your  surgery        will be performed.  7.  Thoroughly rinse your body with warm water from the neck down.  8.  DO NOT shower/wash with your normal soap after using and rinsing off       the CHG Soap.  9.  Pat yourself dry with a clean towel.            10.  Wear clean pajamas.            11.  Place clean sheets on your bed the night of your first shower and do not        sleep with pets.  Day of Surgery  Do not apply any lotions/deoderants the morning of surgery.  Please wear clean clothes to the hospital/surgery center.     Please read over the following fact sheets that you were given. Pain Booklet, Coughing and Deep Breathing and Surgical Site Infection Prevention

## 2015-12-17 NOTE — Anesthesia Preprocedure Evaluation (Signed)
Anesthesia Evaluation  Patient identified by MRN, date of birth, ID band Patient awake    Reviewed: Allergy & Precautions, H&P , Patient's Chart, lab work & pertinent test results  Airway Mallampati: III  TM Distance: >3 FB Neck ROM: full    Dental no notable dental hx. (+) Teeth Intact   Pulmonary neg pulmonary ROS,    Pulmonary exam normal breath sounds clear to auscultation       Cardiovascular hypertension, Pt. on medications  Rhythm:regular Rate:Normal  PIH   Neuro/Psych  Headaches, negative psych ROS   GI/Hepatic negative GI ROS, Neg liver ROS,   Endo/Other  Obesity  Renal/GU negative Renal ROS     Musculoskeletal   Abdominal Normal abdominal exam  (+)   Peds  Hematology negative hematology ROS (+) anemia ,   Anesthesia Other Findings   Reproductive/Obstetrics (+) Pregnancy                             Anesthesia Physical  Anesthesia Plan  ASA: II  Anesthesia Plan: General   Post-op Pain Management:    Induction: Intravenous  Airway Management Planned: Oral ETT  Additional Equipment:   Intra-op Plan:   Post-operative Plan: Extubation in OR  Informed Consent: I have reviewed the patients History and Physical, chart, labs and discussed the procedure including the risks, benefits and alternatives for the proposed anesthesia with the patient or authorized representative who has indicated his/her understanding and acceptance.   Dental advisory given  Plan Discussed with: CRNA  Anesthesia Plan Comments:         Anesthesia Quick Evaluation

## 2015-12-17 NOTE — H&P (Signed)
Annette LeveringMary Sullivan  [facility] Patient #: [id number] DOB: May 28, 1986 Married / Language: English / Race: Black or African American Female   History of Present Illness  Patient words: New-umb hernia.  The patient is a 30 year old female who presents for an evaluation of a hernia. This is a pleasant female referred to me by Dr. Champ MungoJyothie Mann for a symptomatic hernia just above her umbilicus. She reports she has had for some time but is now getting larger and causing Discomfort and Nausea. She Is without Pain Today. She is otherwise healthy without complaints. The pain will be sharp and intermittent and focally above the umbilicus. The pain is moderate in intensity   Other Problems  Anxiety Disorder Umbilical Hernia Repair  Allergies  No Known Drug Allergies03/13/2017  Medication History  No Current Medications Medications Reconciled  Social History No alcohol use No drug use Tobacco use Never smoker.  Family History  First Degree Relatives No pertinent family history  Pregnancy / Birth History  Age at menarche 14 years. Contraceptive History Contraceptive implant. Gravida 3 Maternal age 30-25 Para 2    Review of Systems HEENT Not Present- Earache, Hearing Loss, Hoarseness, Nose Bleed, Oral Ulcers, Ringing in the Ears, Seasonal Allergies, Sinus Pain, Sore Throat, Visual Disturbances, Wears glasses/contact lenses and Yellow Eyes. Respiratory Not Present- Bloody sputum, Chronic Cough, Difficulty Breathing, Snoring and Wheezing. Gastrointestinal Present- Abdominal Pain. Not Present- Bloating, Bloody Stool, Change in Bowel Habits, Chronic diarrhea, Constipation, Difficulty Swallowing, Excessive gas, Gets full quickly at meals, Hemorrhoids, Indigestion, Nausea, Rectal Pain and Vomiting. Female Genitourinary Not Present- Frequency, Nocturia, Painful Urination, Pelvic Pain and Urgency. Musculoskeletal Not Present- Back Pain, Joint Pain, Joint  Stiffness, Muscle Pain, Muscle Weakness and Swelling of Extremities. Neurological Present- Headaches. Not Present- Decreased Memory, Fainting, Numbness, Seizures, Tingling, Tremor, Trouble walking and Weakness. Psychiatric Present- Anxiety. Not Present- Bipolar, Change in Sleep Pattern, Depression, Fearful and Frequent crying. Hematology Not Present- Easy Bruising, Excessive bleeding, Gland problems, HIV and Persistent Infections.  Vitals  Weight: 199.2 lb Height: 65in Body Surface Area: 1.97 m Body Mass Index: 33.15 kg/m  Temp.: 98.5F(Oral)  Pulse: 76 (Regular)  BP: 130/90 (Sitting, Left Arm, Standard)   Physical Exam  General Mental Status-Alert. General Appearance-Consistent with stated age. Hydration-Well hydrated. Voice-Normal.  Head and Neck Head-normocephalic, atraumatic with no lesions or palpable masses. Trachea-midline.  Eye Eyeball - Bilateral-Extraocular movements intact. Sclera/Conjunctiva - Bilateral-No scleral icterus.  Chest and Lung Exam Chest and lung exam reveals -quiet, even and easy respiratory effort with no use of accessory muscles and on auscultation, normal breath sounds, no adventitious sounds and normal vocal resonance. Inspection Chest Wall - Normal. Back - normal.  Cardiovascular Cardiovascular examination reveals -normal heart sounds, regular rate and rhythm with no murmurs and normal pedal pulses bilaterally. Note: She has a systolic ejection murmur   Abdomen Inspection Skin - Scar - no surgical scars. Hernias - Umbilical hernia - Incarcerated. Note: The hernia is more than an inch above the umbilicus. She has a well-healed incision just above the umbilicus as well. Palpation/Percussion Palpation and Percussion of the abdomen reveal - Soft, Non Tender, No Rebound tenderness, No Rigidity (guarding) and No hepatosplenomegaly. Auscultation Auscultation of the abdomen reveals - Bowel sounds  normal.  Neurologic Neurologic evaluation reveals -alert and oriented x 3 with no impairment of recent or remote memory. Mental Status-Normal.  Musculoskeletal Normal Exam - Left-Upper Extremity Strength Normal and Lower Extremity Strength Normal. Normal Exam - Right-Upper Extremity Strength Normal, Lower Extremity  Weakness.    Assessment & Plan  UMBILICAL HERNIA, INCARCERATED (K42.0)  Impression: I discussed the diagnosis with her in detail. This may be an incisional hernia although I suspect is a true umbilical hernia because it does feel like it is several inches above the previous scar. Repair with mesh was recommended. I discussed this with her in detail. I discussed the risks of surgery which includes but is not limited to bleeding, infection, injury to surrounding structures, use of mesh, recurrence, need for further surgery, postoperative recovery, etc. She understands and wishes to proceed with surgery which will be scheduled

## 2015-12-18 ENCOUNTER — Ambulatory Visit (HOSPITAL_COMMUNITY): Payer: 59 | Admitting: Anesthesiology

## 2015-12-18 ENCOUNTER — Ambulatory Visit (HOSPITAL_COMMUNITY)
Admission: RE | Admit: 2015-12-18 | Discharge: 2015-12-18 | Disposition: A | Discharge status: Home / Self Care | Payer: 59 | Source: Ambulatory Visit | Attending: Surgery | Admitting: Surgery

## 2015-12-18 ENCOUNTER — Encounter (HOSPITAL_COMMUNITY)
Admission: RE | Disposition: A | Discharge status: Home / Self Care | Payer: Self-pay | Source: Ambulatory Visit | Attending: Surgery

## 2015-12-18 DIAGNOSIS — E669 Obesity, unspecified: Secondary | ICD-10-CM | POA: Insufficient documentation

## 2015-12-18 DIAGNOSIS — K429 Umbilical hernia without obstruction or gangrene: Secondary | ICD-10-CM | POA: Diagnosis present

## 2015-12-18 DIAGNOSIS — Z6834 Body mass index (BMI) 34.0-34.9, adult: Secondary | ICD-10-CM | POA: Insufficient documentation

## 2015-12-18 DIAGNOSIS — K420 Umbilical hernia with obstruction, without gangrene: Secondary | ICD-10-CM | POA: Insufficient documentation

## 2015-12-18 HISTORY — PX: UMBILICAL HERNIA REPAIR: SHX196

## 2015-12-18 HISTORY — PX: INSERTION OF MESH: SHX5868

## 2015-12-18 SURGERY — REPAIR, HERNIA, UMBILICAL, ADULT
Anesthesia: General

## 2015-12-18 MED ORDER — HYDROMORPHONE HCL 1 MG/ML IJ SOLN
0.2500 mg | INTRAMUSCULAR | Status: DC | PRN
  Administered 2015-12-18 (×2): 0.5 mg via INTRAVENOUS

## 2015-12-18 MED ORDER — LIDOCAINE HCL (CARDIAC) 20 MG/ML IV SOLN
INTRAVENOUS | Status: DC | PRN
  Administered 2015-12-18: 100 mg via INTRAVENOUS

## 2015-12-18 MED ORDER — SODIUM CHLORIDE 0.9 % IV SOLN: 250.0000 mL | INTRAVENOUS | Status: DC | PRN

## 2015-12-18 MED ORDER — LACTATED RINGERS IV SOLN
INTRAVENOUS | Status: DC | PRN
  Administered 2015-12-18: 07:00:00 via INTRAVENOUS

## 2015-12-18 MED ORDER — SODIUM CHLORIDE 0.9% FLUSH: 3.0000 mL | INTRAVENOUS | Status: DC | PRN

## 2015-12-18 MED ORDER — FENTANYL CITRATE (PF) 100 MCG/2ML IJ SOLN
INTRAMUSCULAR | Status: DC | PRN
  Administered 2015-12-18 (×2): 50 ug via INTRAVENOUS
  Administered 2015-12-18: 100 ug via INTRAVENOUS
  Administered 2015-12-18: 50 ug via INTRAVENOUS

## 2015-12-18 MED ORDER — OXYCODONE HCL 5 MG PO TABS: 5.0000 mg | ORAL_TABLET | ORAL | Status: DC | PRN

## 2015-12-18 MED ORDER — ROCURONIUM BROMIDE 50 MG/5ML IV SOLN
INTRAVENOUS | Status: AC
  Filled 2015-12-18: qty 2

## 2015-12-18 MED ORDER — MEPERIDINE HCL 25 MG/ML IJ SOLN: 6.2500 mg | INTRAMUSCULAR | Status: DC | PRN

## 2015-12-18 MED ORDER — ACETAMINOPHEN 650 MG RE SUPP: 650.0000 mg | RECTAL | Status: DC | PRN

## 2015-12-18 MED ORDER — HYDROCODONE-ACETAMINOPHEN 5-325 MG PO TABS: 1.0000 | ORAL_TABLET | Freq: Four times a day (QID) | ORAL | Status: DC | PRN

## 2015-12-18 MED ORDER — ONDANSETRON HCL 4 MG/2ML IJ SOLN
INTRAMUSCULAR | Status: DC | PRN
  Administered 2015-12-18: 4 mg via INTRAVENOUS

## 2015-12-18 MED ORDER — BUPIVACAINE-EPINEPHRINE (PF) 0.25% -1:200000 IJ SOLN
INTRAMUSCULAR | Status: AC
  Filled 2015-12-18: qty 30

## 2015-12-18 MED ORDER — MIDAZOLAM HCL 5 MG/5ML IJ SOLN
INTRAMUSCULAR | Status: DC | PRN
  Administered 2015-12-18: 2 mg via INTRAVENOUS

## 2015-12-18 MED ORDER — SUCCINYLCHOLINE CHLORIDE 20 MG/ML IJ SOLN
INTRAMUSCULAR | Status: DC | PRN
  Administered 2015-12-18: 100 mg via INTRAVENOUS

## 2015-12-18 MED ORDER — MORPHINE SULFATE (PF) 2 MG/ML IV SOLN: 1.0000 mg | INTRAVENOUS | Status: DC | PRN

## 2015-12-18 MED ORDER — PROPOFOL 10 MG/ML IV BOLUS
INTRAVENOUS | Status: DC | PRN
  Administered 2015-12-18: 200 mg via INTRAVENOUS

## 2015-12-18 MED ORDER — BUPIVACAINE-EPINEPHRINE (PF) 0.25% -1:200000 IJ SOLN
INTRAMUSCULAR | Status: DC | PRN
  Administered 2015-12-18: 20 mL

## 2015-12-18 MED ORDER — SODIUM CHLORIDE 0.9% FLUSH: 3.0000 mL | Freq: Two times a day (BID) | INTRAVENOUS | Status: DC

## 2015-12-18 MED ORDER — CEFAZOLIN SODIUM-DEXTROSE 2-4 GM/100ML-% IV SOLN
INTRAVENOUS | Status: AC
  Filled 2015-12-18: qty 100

## 2015-12-18 MED ORDER — ALBUTEROL SULFATE HFA 108 (90 BASE) MCG/ACT IN AERS
INHALATION_SPRAY | RESPIRATORY_TRACT | Status: DC | PRN
  Administered 2015-12-18 (×3): 2 via RESPIRATORY_TRACT

## 2015-12-18 MED ORDER — LIDOCAINE 2% (20 MG/ML) 5 ML SYRINGE
INTRAMUSCULAR | Status: AC
  Filled 2015-12-18: qty 10

## 2015-12-18 MED ORDER — 0.9 % SODIUM CHLORIDE (POUR BTL) OPTIME
TOPICAL | Status: DC | PRN
  Administered 2015-12-18: 1000 mL

## 2015-12-18 MED ORDER — HYDROMORPHONE HCL 1 MG/ML IJ SOLN
INTRAMUSCULAR | Status: AC
  Filled 2015-12-18: qty 1

## 2015-12-18 MED ORDER — PROPOFOL 10 MG/ML IV BOLUS
INTRAVENOUS | Status: AC
  Filled 2015-12-18: qty 20

## 2015-12-18 MED ORDER — ACETAMINOPHEN 325 MG PO TABS: 650.0000 mg | ORAL_TABLET | ORAL | Status: DC | PRN

## 2015-12-18 MED ORDER — DEXTROSE 5 % IV SOLN
2.0000 g | INTRAVENOUS | Status: AC
  Administered 2015-12-18: 2 g via INTRAVENOUS

## 2015-12-18 MED ORDER — KETOROLAC TROMETHAMINE 30 MG/ML IJ SOLN
INTRAMUSCULAR | Status: DC | PRN
  Administered 2015-12-18: 30 mg via INTRAVENOUS

## 2015-12-18 MED ORDER — FENTANYL CITRATE (PF) 250 MCG/5ML IJ SOLN
INTRAMUSCULAR | Status: AC
  Filled 2015-12-18: qty 5

## 2015-12-18 MED ORDER — MIDAZOLAM HCL 2 MG/2ML IJ SOLN
INTRAMUSCULAR | Status: AC
  Filled 2015-12-18: qty 2

## 2015-12-18 SURGICAL SUPPLY — 38 items
BLADE SURG 10 STRL SS (BLADE) ×3 IMPLANT
BLADE SURG 15 STRL LF DISP TIS (BLADE) ×1 IMPLANT
BLADE SURG 15 STRL SS (BLADE) ×2
BLADE SURG ROTATE 9660 (MISCELLANEOUS) IMPLANT
CANISTER SUCTION 2500CC (MISCELLANEOUS) ×3 IMPLANT
CHLORAPREP W/TINT 26ML (MISCELLANEOUS) ×3 IMPLANT
COVER SURGICAL LIGHT HANDLE (MISCELLANEOUS) ×3 IMPLANT
DECANTER SPIKE VIAL GLASS SM (MISCELLANEOUS) ×3 IMPLANT
DRAPE LAPAROTOMY T 98X78 PEDS (DRAPES) ×3 IMPLANT
DRAPE UTILITY XL STRL (DRAPES) ×3 IMPLANT
ELECT CAUTERY BLADE 6.4 (BLADE) ×3 IMPLANT
ELECT REM PT RETURN 9FT ADLT (ELECTROSURGICAL) ×3
ELECTRODE REM PT RTRN 9FT ADLT (ELECTROSURGICAL) ×1 IMPLANT
GLOVE SURG SIGNA 7.5 PF LTX (GLOVE) ×3 IMPLANT
GOWN STRL REUS W/ TWL LRG LVL3 (GOWN DISPOSABLE) ×1 IMPLANT
GOWN STRL REUS W/ TWL XL LVL3 (GOWN DISPOSABLE) ×1 IMPLANT
GOWN STRL REUS W/TWL LRG LVL3 (GOWN DISPOSABLE) ×2
GOWN STRL REUS W/TWL XL LVL3 (GOWN DISPOSABLE) ×2
KIT BASIN OR (CUSTOM PROCEDURE TRAY) ×3 IMPLANT
KIT ROOM TURNOVER OR (KITS) ×3 IMPLANT
LIQUID BAND (GAUZE/BANDAGES/DRESSINGS) ×3 IMPLANT
MESH VENTRALEX ST 1-7/10 CRC S (Mesh General) ×3 IMPLANT
NEEDLE HYPO 25GX1X1/2 BEV (NEEDLE) ×3 IMPLANT
NS IRRIG 1000ML POUR BTL (IV SOLUTION) ×3 IMPLANT
PACK SURGICAL SETUP 50X90 (CUSTOM PROCEDURE TRAY) ×3 IMPLANT
PAD ARMBOARD 7.5X6 YLW CONV (MISCELLANEOUS) ×3 IMPLANT
PENCIL BUTTON HOLSTER BLD 10FT (ELECTRODE) ×3 IMPLANT
SPONGE LAP 18X18 X RAY DECT (DISPOSABLE) IMPLANT
SUT MNCRL AB 4-0 PS2 18 (SUTURE) ×3 IMPLANT
SUT NOVA NAB DX-16 0-1 5-0 T12 (SUTURE) ×3 IMPLANT
SUT VIC AB 3-0 SH 27 (SUTURE) ×2
SUT VIC AB 3-0 SH 27X BRD (SUTURE) ×1 IMPLANT
SYR CONTROL 10ML LL (SYRINGE) ×3 IMPLANT
TOWEL OR 17X24 6PK STRL BLUE (TOWEL DISPOSABLE) ×3 IMPLANT
TOWEL OR 17X26 10 PK STRL BLUE (TOWEL DISPOSABLE) ×3 IMPLANT
TUBE CONNECTING 12'X1/4 (SUCTIONS) ×1
TUBE CONNECTING 12X1/4 (SUCTIONS) ×2 IMPLANT
YANKAUER SUCT BULB TIP NO VENT (SUCTIONS) ×3 IMPLANT

## 2015-12-18 NOTE — Anesthesia Procedure Notes (Signed)
Procedure Name: Intubation Date/Time: 12/18/2015 7:33 AM Performed by: Rosiland OzMEYERS, Denim Kalmbach Pre-anesthesia Checklist: Patient identified, Timeout performed, Emergency Drugs available, Suction available and Patient being monitored Patient Re-evaluated:Patient Re-evaluated prior to inductionOxygen Delivery Method: Circle system utilized Preoxygenation: Pre-oxygenation with 100% oxygen Intubation Type: IV induction Ventilation: Mask ventilation without difficulty Laryngoscope Size: Miller and 2 Grade View: Grade I Tube type: Oral Tube size: 7.5 mm Number of attempts: 1 Airway Equipment and Method: Stylet Placement Confirmation: ETT inserted through vocal cords under direct vision,  positive ETCO2 and breath sounds checked- equal and bilateral Secured at: 21 cm Tube secured with: Tape Dental Injury: Teeth and Oropharynx as per pre-operative assessment

## 2015-12-18 NOTE — Op Note (Signed)
HERNIA REPAIR UMBILICAL ADULT WITH MESH , INSERTION OF MESH  Procedure Note  Annette RobinsonMary Sullivan Sullivan 12/18/2015   Pre-op Diagnosis: Umbilical hernia      Post-op Diagnosis: same  Procedure(s): HERNIA REPAIR UMBILICAL ADULT WITH MESH  INSERTION OF MESH  Surgeon(s): Abigail Miyamotoouglas Skai Lickteig, MD  Anesthesia: General  Staff:  Circulator: Christene Slatesushdan M Islam, RN Scrub Person: Carmela Rimaebecca K Leggio Circulator Assistant: Woodroe ModeKelly Sullivan Hickling, RN  Estimated Blood Loss: Minimal                         Annette Sullivan   Date: 12/18/2015  Time: 8:02 AM

## 2015-12-18 NOTE — Op Note (Signed)
NAMGrayland Ormond:  Annette Sullivan, Annette Sullivan                   ACCOUNT NO.:  [id number]  MEDICAL RECORD NO.:  [id number]  LOCATION:  [facility]                         FACILITY:  [facility]  PHYSICIAN:  Annette Sullivan, M.D. DATE OF BIRTH:  01-15-1986  DATE OF PROCEDURE:  12/18/2015 DATE OF DISCHARGE:                              OPERATIVE REPORT   PREOPERATIVE DIAGNOSIS:  Umbilical hernia.  POSTOPERATIVE DIAGNOSIS:  Umbilical hernia.  PROCEDURE:  Repair of umbilical hernia with mesh.  SURGEON:  Annette Sullivan, M.D.  ANESTHESIA:  General with 0.25% Marcaine.  ESTIMATED BLOOD LOSS:  Minimal.  FINDINGS:  The patient was found to have a small fascial defect several inches above the umbilicus.  There was a large hernia sac containing preperitoneal fat and omentum.  The hernia was repaired with a 4.3 cm round Ventralex ST patch from [employer].  PROCEDURE IN DETAIL:  The patient was brought to the operating room, identified as Annette Sullivan.  She was placed supine on the operating room table.  General anesthesia was induced.  Her abdomen was then prepped and draped in usual sterile fashion.  I made a small vertical incision several inches above the umbilicus over the palpable hernia.  I took this down to the subcutaneous tissue with electrocautery.  The large hernia sac was identified and elevated.  I followed it down to the fascia.  The actual fascial defect was only a centimeter in size.  I excised a little large sac.  It was found to contain only a small amount of omentum and preperitoneal fat which was reduced back into the abdominal cavity.  I then brought a 4.3 cm round Ventralex patch onto the field from [employer].  I placed it through the fascial opening and then pulled it up against the perineum with stay ties.  I then sewed the mesh in place circumferentially with #1 Novafil sutures.  I then cut the stay ties and closed the fascia over top of the mesh with figure-of-eight #1 Novafil suture as well.  I then  anesthetized the fascia and skin with Marcaine.  I closed subcutaneous tissue with interrupted 3-0 Vicryl sutures and closed the skin with a running 4-0 Monocryl.  Skin glue was then applied.  The patient tolerated the procedure well.  All the counts were correct at the end of procedure.  The patient was then extubated in the operating room and taken in a stable condition to the recovery room.    Annette Sullivan, M.D.    DB/MEDQ  D:  12/18/2015  T:  12/18/2015  Job:  [id number]

## 2015-12-18 NOTE — Transfer of Care (Signed)
Immediate Anesthesia Transfer of Care Note  Patient: Annette Sullivan  Procedure(s) Performed: Procedure(s): HERNIA REPAIR UMBILICAL ADULT WITH MESH  (N/A) INSERTION OF MESH (N/A)  Patient Location: PACU  Anesthesia Type:General  Level of Consciousness: sedated and patient cooperative  Airway & Oxygen Therapy: Patient Spontanous Breathing and Patient connected to face mask oxygen  Post-op Assessment: Report given to RN and Post -op Vital signs reviewed and stable  Post vital signs: Reviewed  Last Vitals:  Filed Vitals:   12/18/15 0637 12/18/15 0813  BP: 135/73   Pulse: 63   Temp: 36.9 C 36.4 C  Resp: 18     Last Pain: There were no vitals filed for this visit.    Patients Stated Pain Goal: 2 (12/18/15 [id number])  Complications: No apparent anesthesia complications

## 2015-12-18 NOTE — Interval H&P Note (Signed)
History and Physical Interval Note:no change in H and P  12/18/2015 6:41 AM  Annette RobinsonMary A Sabel  has presented today for surgery, with the diagnosis of Umbilical hernia   The various methods of treatment have been discussed with the patient and family. After consideration of risks, benefits and other options for treatment, the patient has consented to  Procedure(s): HERNIA REPAIR UMBILICAL ADULT WITH MESH  (N/A) INSERTION OF MESH (N/A) as a surgical intervention .  The patient's history has been reviewed, patient examined, no change in status, stable for surgery.  I have reviewed the patient's chart and labs.  Questions were answered to the patient's satisfaction.     [name]

## 2015-12-18 NOTE — Discharge Instructions (Signed)
[facility] _______[facility] ° °UMBILICAL OR INGUINAL HERNIA REPAIR: POST OP INSTRUCTIONS ° °Always review your discharge instruction sheet given to you by the facility where your surgery was performed. °IF YOU HAVE DISABILITY OR FAMILY LEAVE FORMS, YOU MUST BRING THEM TO THE OFFICE FOR PROCESSING.   °DO NOT GIVE THEM TO YOUR DOCTOR. ° °1. A  prescription for pain medication may be given to you upon discharge.  Take your pain medication as prescribed, if needed.  If narcotic pain medicine is not needed, then you may take acetaminophen (Tylenol) or ibuprofen (Advil) as needed. °2. Take your usually prescribed medications unless otherwise directed. °3. If you need a refill on your pain medication, please contact your pharmacy.  They will contact our office to request authorization. Prescriptions will not be filled after 5 pm or on week-ends. °4. You should follow a light diet the first 24 hours after arrival home, such as soup and crackers, etc.  Be sure to include lots of fluids daily.  Resume your normal diet the day after surgery. °5. Most patients will experience some swelling and bruising around the umbilicus or in the groin and scrotum.  Ice packs and reclining will help.  Swelling and bruising can take several days to resolve.  °6. It is common to experience some constipation if taking pain medication after surgery.  Increasing fluid intake and taking a stool softener (such as Colace) will usually help or prevent this problem from occurring.  A mild laxative (Milk of Magnesia or Miralax) should be taken according to package directions if there are no bowel movements after 48 hours. °7. Unless discharge instructions indicate otherwise, you may remove your bandages 24-48 hours after surgery, and you may shower at that time.  You may have steri-strips (small skin tapes) in place directly over the incision.  These strips should be left on the skin for 7-10 days.  If your surgeon used skin glue on the  incision, you may shower in 24 hours.  The glue will flake off over the next 2-3 weeks.  Any sutures or staples will be removed at the office during your follow-up visit. °8. ACTIVITIES:  You may resume regular (light) daily activities beginning the next day--such as daily self-care, walking, climbing stairs--gradually increasing activities as tolerated.  You may have sexual intercourse when it is comfortable.  Refrain from any heavy lifting or straining until approved by your doctor. °a. You may drive when you are no longer taking prescription pain medication, you can comfortably wear a seatbelt, and you can safely maneuver your car and apply brakes. °b. RETURN TO WORK:  __________________________________________________________ °9. You should see your doctor in the office for a follow-up appointment approximately 2-3 weeks after your surgery.  Make sure that you call for this appointment within a day or two after you arrive home to insure a convenient appointment time. °10. OTHER INSTRUCTIONS: NO LIFTING MORE THAN 15 POUNDS FOR 4 WEEKS __________________________________________________________________________________________________________________________________________________________________________________________  °WHEN TO CALL YOUR DOCTOR: °1. Fever over 101.0 °2. Inability to urinate °3. Nausea and/or vomiting °4. Extreme swelling or bruising °5. Continued bleeding from incision. °6. Increased pain, redness, or drainage from the incision ° °The clinic staff is available to answer your questions during regular business hours.  Please don’t hesitate to call and ask to speak to one of the nurses for clinical concerns.  If you have a medical emergency, go to the nearest emergency room or call 911.  A surgeon from [facility] is   always on call at the hospital ° ° °[address], [address], [place], [place]  [address] ? ° [address] [address], [place], [place]   [address] °[phone] ? [phone] ?  FAX [phone] °Web site: [email or web address] °

## 2015-12-18 NOTE — Anesthesia Postprocedure Evaluation (Signed)
Anesthesia Post Note  Patient: Annette Sullivan  Procedure(s) Performed: Procedure(s) (LRB): HERNIA REPAIR UMBILICAL ADULT WITH MESH  (N/A) INSERTION OF MESH (N/A)  Patient location during evaluation: PACU Anesthesia Type: General Level of consciousness: sedated and patient cooperative Pain management: pain level controlled Vital Signs Assessment: post-procedure vital signs reviewed and stable Respiratory status: spontaneous breathing Cardiovascular status: stable Anesthetic complications: no    Last Vitals:  Filed Vitals:   12/18/15 0845 12/18/15 0857  BP: 147/96   Pulse: 90 80  Temp:    Resp: 15 15    Last Pain:  Filed Vitals:   12/18/15 0901  PainSc: Asleep                 Lewie LoronJohn Armany Mano

## 2015-12-19 ENCOUNTER — Encounter (HOSPITAL_COMMUNITY): Payer: Self-pay | Admitting: Surgery

## 2015-12-27 ENCOUNTER — Emergency Department (HOSPITAL_COMMUNITY): Payer: 59

## 2015-12-27 ENCOUNTER — Emergency Department (HOSPITAL_COMMUNITY)
Admission: EM | Admit: 2015-12-27 | Discharge: 2015-12-27 | Disposition: A | Discharge status: Home / Self Care | Payer: 59 | Attending: Emergency Medicine | Admitting: Emergency Medicine

## 2015-12-27 ENCOUNTER — Encounter (HOSPITAL_COMMUNITY): Payer: Self-pay

## 2015-12-27 DIAGNOSIS — R109 Unspecified abdominal pain: Secondary | ICD-10-CM | POA: Diagnosis not present

## 2015-12-27 DIAGNOSIS — Z3202 Encounter for pregnancy test, result negative: Secondary | ICD-10-CM | POA: Diagnosis not present

## 2015-12-27 DIAGNOSIS — Z862 Personal history of diseases of the blood and blood-forming organs and certain disorders involving the immune mechanism: Secondary | ICD-10-CM | POA: Diagnosis not present

## 2015-12-27 DIAGNOSIS — R197 Diarrhea, unspecified: Secondary | ICD-10-CM | POA: Insufficient documentation

## 2015-12-27 DIAGNOSIS — Z79899 Other long term (current) drug therapy: Secondary | ICD-10-CM | POA: Insufficient documentation

## 2015-12-27 DIAGNOSIS — E669 Obesity, unspecified: Secondary | ICD-10-CM | POA: Insufficient documentation

## 2015-12-27 DIAGNOSIS — R112 Nausea with vomiting, unspecified: Secondary | ICD-10-CM | POA: Diagnosis present

## 2015-12-27 LAB — COMPREHENSIVE METABOLIC PANEL
ALBUMIN: 3.9 g/dL (ref 3.5–5.0)
ALT: 27 U/L (ref 14–54)
AST: 22 U/L (ref 15–41)
Alkaline Phosphatase: 45 U/L (ref 38–126)
Anion gap: 10 (ref 5–15)
BILIRUBIN TOTAL: 1 mg/dL (ref 0.3–1.2)
BUN: 6 mg/dL (ref 6–20)
CHLORIDE: 100 mmol/L — AB (ref 101–111)
CO2: 27 mmol/L (ref 22–32)
Calcium: 10.1 mg/dL (ref 8.9–10.3)
Creatinine, Ser: 0.63 mg/dL (ref 0.44–1.00)
GFR calc Af Amer: >60 mL/min (ref >60)
GFR calc non Af Amer: >60 mL/min (ref >60)
GLUCOSE: 93 mg/dL (ref 65–99)
POTASSIUM: 3.5 mmol/L (ref 3.5–5.1)
SODIUM: 137 mmol/L (ref 135–145)
Total Protein: 7.1 g/dL (ref 6.5–8.1)

## 2015-12-27 LAB — URINALYSIS, ROUTINE W REFLEX MICROSCOPIC
BILIRUBIN URINE: NEGATIVE
GLUCOSE, UA: NEGATIVE mg/dL
Hgb urine dipstick: NEGATIVE
KETONES UR: NEGATIVE mg/dL
Leukocytes, UA: NEGATIVE
Nitrite: NEGATIVE
PH: 7.5 (ref 5.0–8.0)
Protein, ur: NEGATIVE mg/dL
Specific Gravity, Urine: 1.024 (ref 1.005–1.030)

## 2015-12-27 LAB — CBC
HEMATOCRIT: 39.2 % (ref 36.0–46.0)
Hemoglobin: 12.8 g/dL (ref 12.0–15.0)
MCH: 27.1 pg (ref 26.0–34.0)
MCHC: 32.7 g/dL (ref 30.0–36.0)
MCV: 82.9 fL (ref 78.0–100.0)
Platelets: 238 10*3/uL (ref 150–400)
RBC: 4.73 MIL/uL (ref 3.87–5.11)
RDW: 12 % (ref 11.5–15.5)
WBC: 6 10*3/uL (ref 4.0–10.5)

## 2015-12-27 LAB — LIPASE, BLOOD: LIPASE: 27 U/L (ref 11–51)

## 2015-12-27 LAB — PREGNANCY, URINE: Preg Test, Ur: NEGATIVE

## 2015-12-27 LAB — I-STAT BETA HCG BLOOD, ED (MC, WL, AP ONLY): I-stat hCG, quantitative: <5 m[IU]/mL (ref <5)

## 2015-12-27 MED ORDER — HYDROMORPHONE HCL 1 MG/ML IJ SOLN
1.0000 mg | Freq: Once | INTRAMUSCULAR | Status: AC
  Administered 2015-12-27: 1 mg via INTRAVENOUS
  Filled 2015-12-27: qty 1

## 2015-12-27 MED ORDER — ONDANSETRON 4 MG PO TBDP: ORAL_TABLET | ORAL | Status: DC

## 2015-12-27 MED ORDER — IOPAMIDOL (ISOVUE-300) INJECTION 61%
INTRAVENOUS | Status: AC
  Administered 2015-12-27: 100 mL
  Filled 2015-12-27: qty 100

## 2015-12-27 MED ORDER — ONDANSETRON HCL 4 MG/2ML IJ SOLN
4.0000 mg | Freq: Once | INTRAMUSCULAR | Status: AC
  Administered 2015-12-27: 4 mg via INTRAVENOUS
  Filled 2015-12-27: qty 2

## 2015-12-27 NOTE — ED Notes (Signed)
To ct

## 2015-12-27 NOTE — ED Notes (Signed)
The pt is c/o abd pain since this am with vomiting and diarrhea.  Surgery for abd hernia may 25th no problems with that surgery.  Incision not red no pain in incision

## 2015-12-27 NOTE — ED Notes (Addendum)
Patient here with abdominal cramping since yesterday with vomiting and diarrhea, decreased appetite since yesterday. Concerned because she had umbilical hernia repair 5/.25. No vomiting on arrival. No distress.

## 2015-12-27 NOTE — ED Provider Notes (Signed)
CSN: [id number]     Arrival date & time 12/27/15  1653 History   First MD Initiated Contact with Patient 12/27/15 1823     Chief Complaint  Patient presents with  . Emesis     (Consider location/radiation/quality/duration/timing/severity/associated sxs/prior Treatment) Patient is a 30 y.o. female presenting with vomiting.  Emesis Severity:  Mild Duration:  1 day Timing:  Constant Quality:  Stomach contents Progression:  Improving Chronicity:  New Recent urination:  Normal Relieved by:  Nothing Worsened by:  Nothing tried Ineffective treatments:  None tried Associated symptoms: abdominal pain and diarrhea   Associated symptoms: no cough and no fever     Past Medical History  Diagnosis Date  . No pertinent past medical history   . Headache(784.0)   . Anemia   . Obesity (BMI 30-39.9)    Past Surgical History  Procedure Laterality Date  . Appendectomy    . Umbilical hernia repair N/A 12/18/2015    Procedure: HERNIA REPAIR UMBILICAL ADULT WITH MESH ;  Surgeon: Abigail Miyamoto, MD;  Location: [facility] OR;  Service: General;  Laterality: N/A;  . Insertion of mesh N/A 12/18/2015    Procedure: INSERTION OF MESH;  Surgeon: Abigail Miyamoto, MD;  Location: [facility] OR;  Service: General;  Laterality: N/A;   Family History  Problem Relation Age of Onset  . Anesthesia problems Neg Hx    Social History  Substance Use Topics  . Smoking status: Never Smoker   . Smokeless tobacco: Never Used  . Alcohol Use: No   OB History    Gravida Para Term Preterm AB TAB SAB Ectopic Multiple Living   3 2 2  0 1 1 0 0 0 2     Review of Systems  Gastrointestinal: Positive for nausea, vomiting, abdominal pain and diarrhea.  All other systems reviewed and are negative.     Allergies  Review of patient's allergies indicates no known allergies.  Home Medications   Prior to Admission medications   Medication Sig Start Date End Date Taking? Authorizing Provider  acetaminophen (TYLENOL) 325 MG tablet  Take 650 mg by mouth every 6 (six) hours as needed for mild pain or moderate pain.   Yes Historical Provider, MD  HYDROcodone-acetaminophen (NORCO) 5-325 MG tablet Take 1-2 tablets by mouth every 6 (six) hours as needed for moderate pain. 12/18/15  Yes Abigail Miyamoto, MD  Multiple Vitamins-Minerals (MULTI-VITAMIN GUMMIES) CHEW Chew 2 each by mouth daily.   Yes Historical Provider, MD  ondansetron (ZOFRAN ODT) 4 MG disintegrating tablet 4mg  ODT q4 hours prn nausea/vomit 12/27/15   Marily Memos, MD   BP 127/78 mmHg  Pulse 88  Temp(Src) 99.3 F (37.4 C) (Oral)  Resp 16  SpO2 95%  LMP 12/05/2015 Physical Exam  Constitutional: She is oriented to person, place, and time. She appears well-developed and well-nourished.  HENT:  Head: Normocephalic and atraumatic.  Mouth/Throat: Mucous membranes are dry.  Neck: Normal range of motion.  Cardiovascular: Normal rate and regular rhythm.   Pulmonary/Chest: No stridor. No respiratory distress.  Abdominal: Soft. She exhibits no distension.  Musculoskeletal: Normal range of motion. She exhibits no edema or tenderness.  Neurological: She is alert and oriented to person, place, and time. No cranial nerve deficit. Coordination normal.  Skin: Skin is warm and dry. No rash noted. No erythema. No pallor.  Periumbilical incision, c/d/i no induration  Nursing note and vitals reviewed.   ED Course  Procedures (including critical care time) Labs Review Labs Reviewed  COMPREHENSIVE METABOLIC PANEL - Abnormal;  Notable for the following:    Chloride 100 (*)    All other components within normal limits  URINALYSIS, ROUTINE W REFLEX MICROSCOPIC (NOT AT [facility]) - Abnormal; Notable for the following:    APPearance CLOUDY (*)    All other components within normal limits  LIPASE, BLOOD  CBC  PREGNANCY, URINE  I-STAT BETA HCG BLOOD, ED ([facility], [facility], [facility] ONLY)    Imaging Review Ct Abdomen Pelvis W Contrast  12/27/2015  CLINICAL DATA:  30 year old female with nausea,  vomiting, diarrhea and abdominal discomfort. History of umbilical hernia repair 9 days ago. EXAM: CT ABDOMEN AND PELVIS WITH CONTRAST TECHNIQUE: Multidetector CT imaging of the abdomen and pelvis was performed using the standard protocol following bolus administration of intravenous contrast. CONTRAST:  ISOVUE-300 IOPAMIDOL (ISOVUE-300) INJECTION 61% COMPARISON:  None. FINDINGS: Lower chest:  Minimal dependent/ basilar atelectasis noted. Hepatobiliary: A 1 cm hypodense lesion within the right liver (image 14) is nonspecific, but likely statistically benign. The remainder of the liver and gallbladder are unremarkable. There is no evidence of biliary dilatation. Pancreas: Unremarkable Spleen: Appear unremarkable Adrenals/Urinary Tract: All the kidneys, adrenal glands and bladder are unremarkable. Stomach/Bowel: There is no evidence bowel obstruction or bowel wall thickening. Colonic diverticulosis noted without evidence of diverticulitis. Vascular/Lymphatic: Unremarkable Reproductive: Two separate 2.5 cm right ovarian cysts are noted. Other cysts/follicles within the ovaries identified. Uterus is within normal limits. No adnexal inflammation identified. Other: No free fluid or pneumoperitoneum. Musculoskeletal: Postoperative changes within the supraumbilical anterior subcutaneous tissues identified. A 2 x 2.3 cm subcutaneous fluid and gas collection at the site of hernia repair noted and infection is not excluded. It is difficult to determine if there is a small recurrent hernia containing fat in this area. No acute or suspicious bony abnormalities are identified. IMPRESSION: Supraumbilical postoperative changes with 2 x 2.3 cm subcutaneous fluid and gas collection - infection/ infected collection is not excluded. It is difficult to determine if there is a small recurrent hernia containing fat in this area. Consider follow-up. No evidence of acute bowel abnormality. Colonic diverticulosis without evidence of  diverticulitis. Electronically Signed   By: Harmon Pier M.D.   On: 12/27/2015 20:12   I have personally reviewed and evaluated these images and lab results as part of my medical decision-making.   EKG Interpretation None      MDM   Final diagnoses:  Non-intractable vomiting with nausea, vomiting of unspecified type   Likely gastroenteritis However since she recently had a hernia. With mesh will evaluate CT scan to ensure no obvious, patient's from that. Suspect they will be some no [place] and possibly fluid in her abdomen however I want to ensure there is no evidence of bowel blockage or injury.  Patient without fever, her white blood cell count is normal. THE REST her vital signs are also normal. Has a small fluid collection is likely a seroma from her recent surgery I doubt abscess at this time. She will continue following up with the surgeon as needed. Patient tolerating by mouth at the time of discharge likely gastroenteritis will give her a prescription for Zofran. Patient requesting anxiety medicatioN, however feel like this is a primary care request and she can follow-up with her doctor to get that.  New Prescriptions: New Prescriptions   ONDANSETRON (ZOFRAN ODT) 4 MG DISINTEGRATING TABLET     ODT q4 hours prn nausea/vomit    I have personally and contemperaneously reviewed labs and imaging and used in my decision making as  above.   A medical screening exam was performed and I feel the patient has had an appropriate workup for their chief complaint at this time and likelihood of emergent condition existing is low and thus workup can continue on an outpatient basis.. Their vital signs are stable. They have been counseled on decision, discharge, follow up and which symptoms necessitate immediate return to the emergency department.  They verbally stated understanding and agreement with plan and discharged in stable condition.    Marily MemosJason Ehren Berisha, MD 12/27/15 [id number]

## 2015-12-27 NOTE — ED Notes (Signed)
Pt asking for anxiety medicine 

## 2015-12-27 NOTE — ED Notes (Signed)
The pt is not feeling any better  Whenever the dye was placed she vomited again

## 2016-06-09 ENCOUNTER — Ambulatory Visit (INDEPENDENT_AMBULATORY_CARE_PROVIDER_SITE_OTHER): Payer: 59 | Admitting: *Deleted

## 2016-06-09 DIAGNOSIS — Z3202 Encounter for pregnancy test, result negative: Secondary | ICD-10-CM

## 2016-06-09 DIAGNOSIS — N912 Amenorrhea, unspecified: Secondary | ICD-10-CM | POA: Diagnosis not present

## 2016-06-09 LAB — POCT URINE PREGNANCY: Preg Test, Ur: NEGATIVE

## 2017-06-29 IMAGING — CT CT ABD-PELV W/ CM
2 of 4 series · 16 of 46 positions shown, 18 images · IV contrast (Omni 300)
Comparison: None.

CLINICAL DATA: 30-year-old female with nausea, vomiting, diarrhea
and abdominal discomfort. History of umbilical hernia repair 9 days
ago.

EXAM:
CT ABDOMEN AND PELVIS WITH CONTRAST
TECHNIQUE: Multidetector CT imaging of the abdomen and pelvis was performed
using the standard protocol following bolus administration of
intravenous contrast.
CONTRAST:  100mL 6OFS0Q-CHH IOPAMIDOL (6OFS0Q-CHH) INJECTION 61%

[Series 2: a/p w/ 5mm · axial · 0.87mm/px · z∈[-356,+94]mm · 13 of 98 slices shown, 15 images]
[im 4/98  soft-tissue]
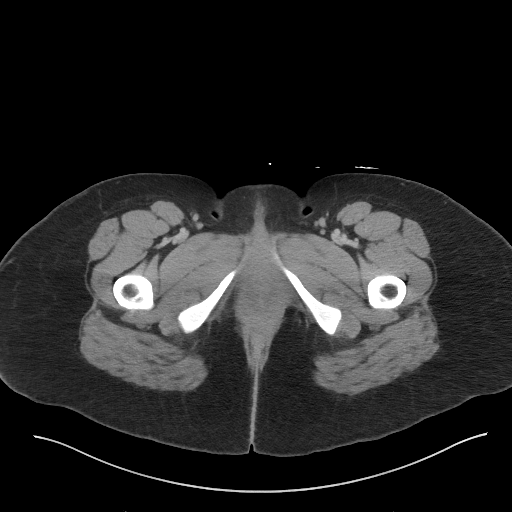
[im 4/98  bone]
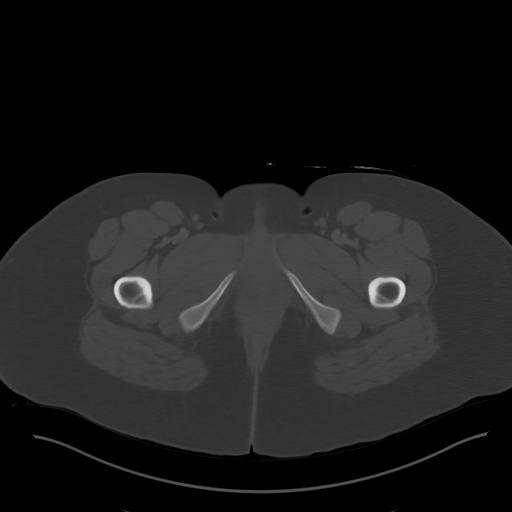
[im 12/98  soft-tissue]
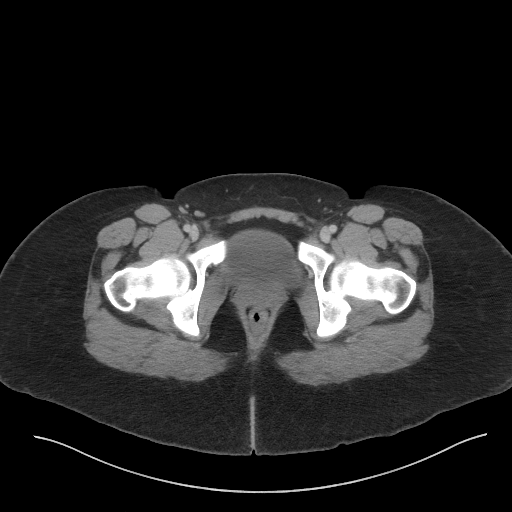
[im 20/98  soft-tissue]
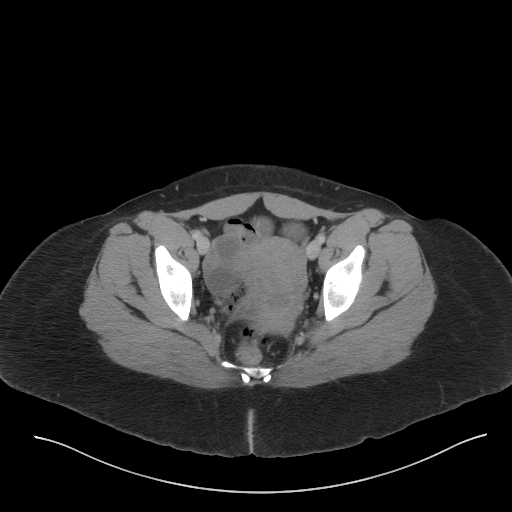
[im 28/98  soft-tissue]
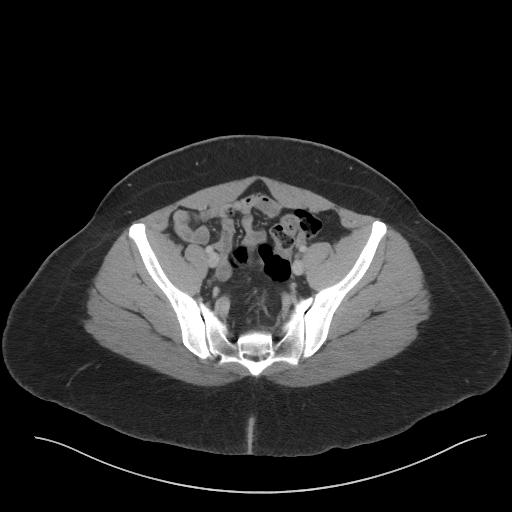
[im 35/98  soft-tissue]
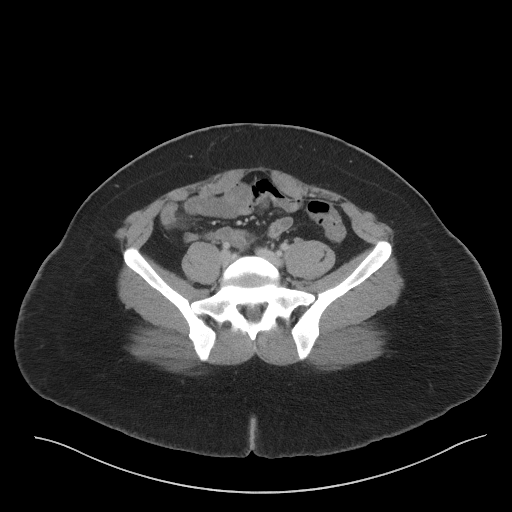
[im 43/98  soft-tissue]
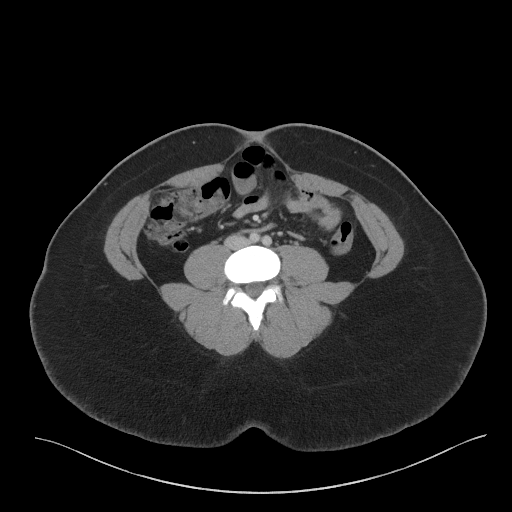
[im 51/98  soft-tissue]
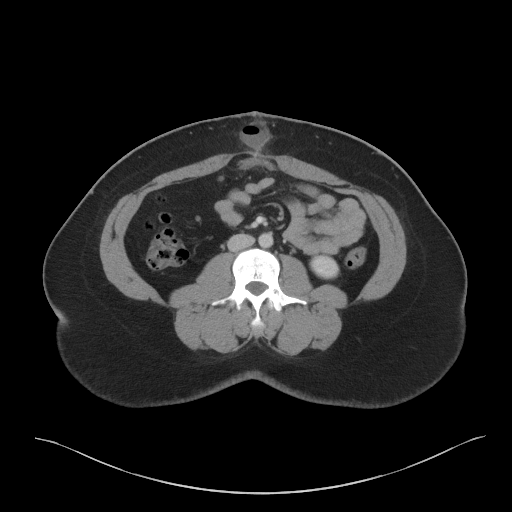
[im 55/98  soft-tissue]
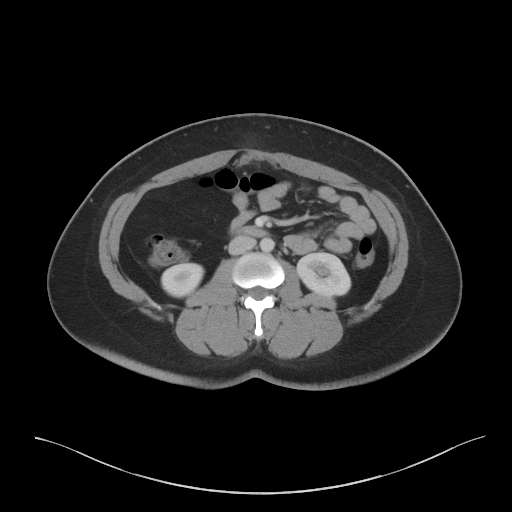
[im 63/98  soft-tissue]
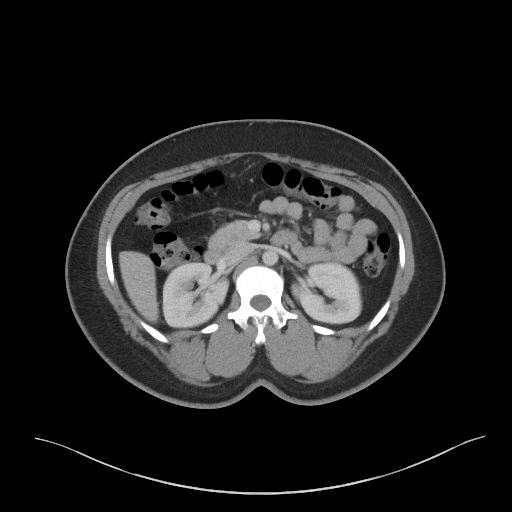
[im 63/98  bone]
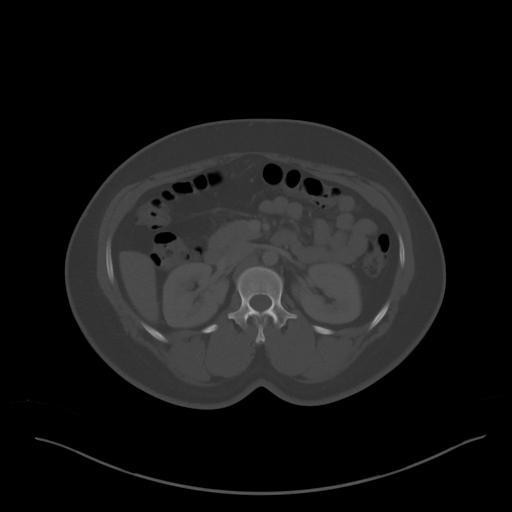
[im 70/98  soft-tissue]
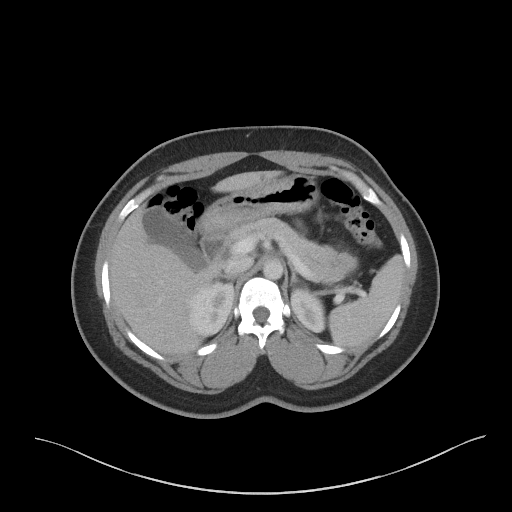
[im 78/98  soft-tissue]
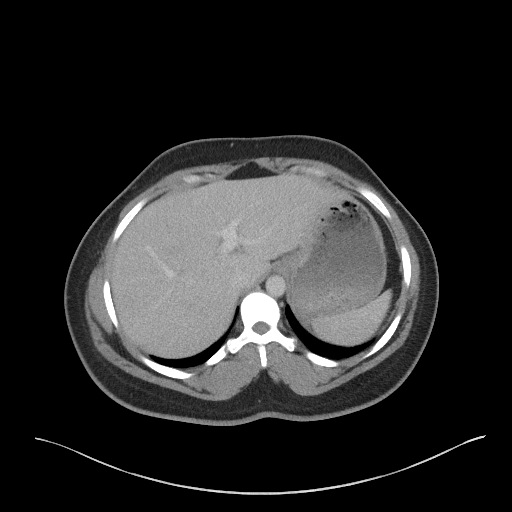
[im 86/98  soft-tissue]
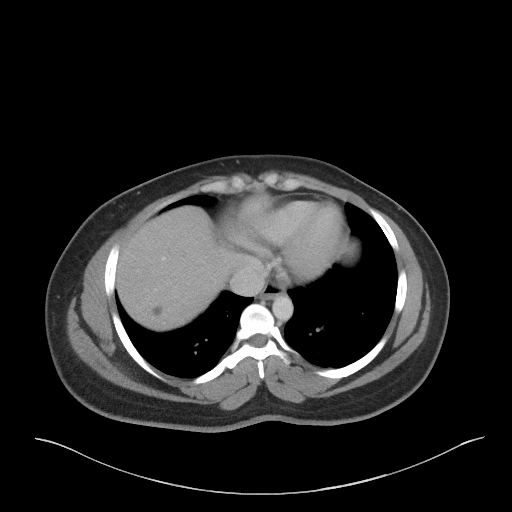
[im 94/98  soft-tissue]
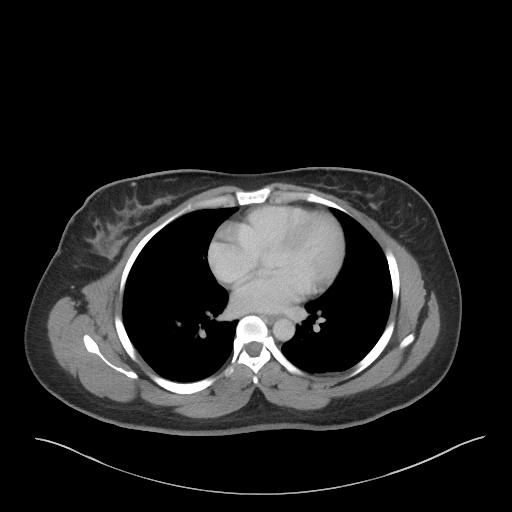

[Series 5: a/p w/ cor · coronal · 0.89mm/px · 3 of 139 slices shown]
[im 47/139  soft-tissue]
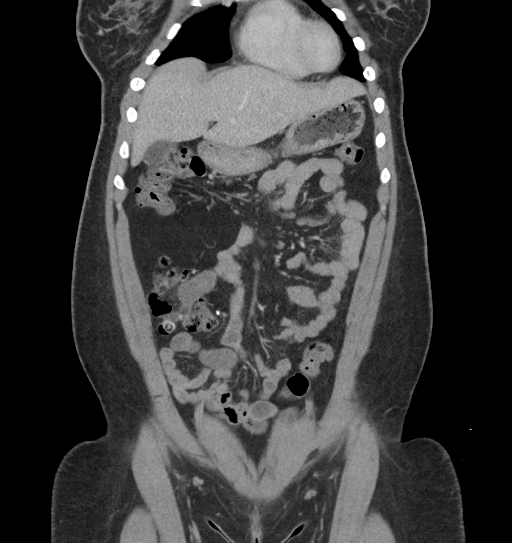
[im 62/139  soft-tissue]
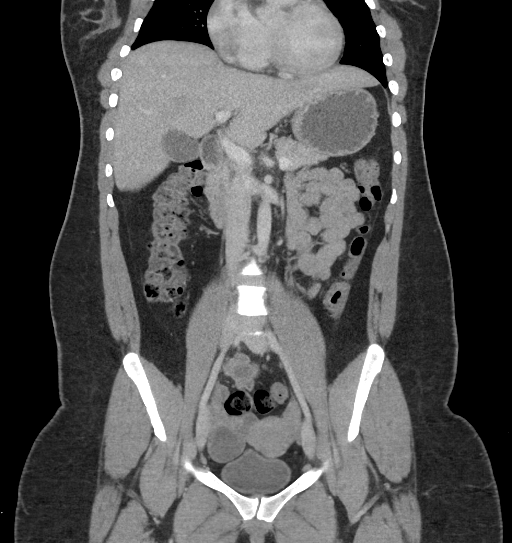
[im 77/139  soft-tissue]
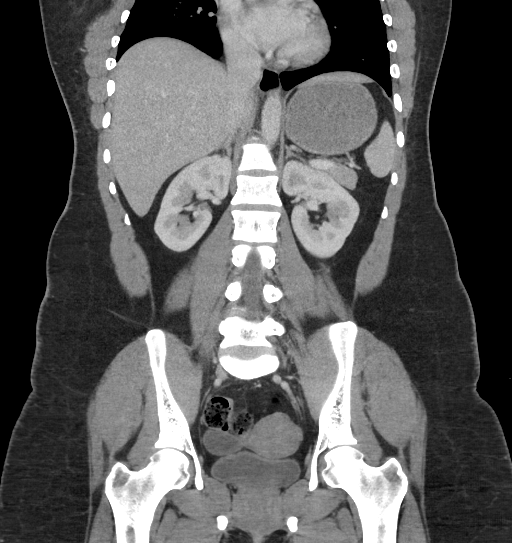

[16 of 46 positions shown; findings below may reference images not displayed]

FINDINGS: Lower chest:  Minimal dependent/ basilar atelectasis noted.

Hepatobiliary: A 1 cm hypodense lesion within the right liver (image
14) is nonspecific, but likely statistically benign. The remainder
of the liver and gallbladder are unremarkable. There is no evidence
of biliary dilatation.

Pancreas: Unremarkable

Spleen: Appear unremarkable

Adrenals/Urinary Tract: All the kidneys, adrenal glands and bladder
are unremarkable.

Stomach/Bowel: There is no evidence bowel obstruction or bowel wall
thickening. Colonic diverticulosis noted without evidence of
diverticulitis.

Vascular/Lymphatic: Unremarkable

Reproductive: Two separate 2.5 cm right ovarian cysts are noted.
Other cysts/follicles within the ovaries identified. Uterus is
within normal limits. No adnexal inflammation identified.

Other: No free fluid or pneumoperitoneum.

Musculoskeletal: Postoperative changes within the supraumbilical
anterior subcutaneous tissues identified. A 2 x 2.3 cm subcutaneous
fluid and gas collection at the site of hernia repair noted and
infection is not excluded. It is difficult to determine if there is
a small recurrent hernia containing fat in this area.

No acute or suspicious bony abnormalities are identified.
IMPRESSION: Supraumbilical postoperative changes with 2 x 2.3 cm subcutaneous
fluid and gas collection - infection/ infected collection is not
excluded. It is difficult to determine if there is a small recurrent
hernia containing fat in this area. Consider follow-up.

No evidence of acute bowel abnormality. Colonic diverticulosis
without evidence of diverticulitis.

## 2017-07-26 NOTE — L&D Delivery Note (Signed)
Delivery Note At 1:48 PM a viable female was delivered via Vaginal, Spontaneous (Presentation: direct OA).  APGAR: 7, 9; weight  pending.   Placenta status: spont intact.  Cord:  3V with the following complications: none.  Cord pH: n/a  Anesthesia:   Episiotomy: None Lacerations: None Suture Repair: n/a Est. Blood Loss (mL):  Less than 100 cc  Mom to postpartum.  Baby to Couplet care / Skin to Skin.  [facility] N[employer] 04/19/2018, 2:11 PM

## 2017-10-21 DIAGNOSIS — Z3480 Encounter for supervision of other normal pregnancy, unspecified trimester: Secondary | ICD-10-CM | POA: Insufficient documentation

## 2017-10-21 DIAGNOSIS — F419 Anxiety disorder, unspecified: Secondary | ICD-10-CM

## 2017-10-21 DIAGNOSIS — Z8759 Personal history of other complications of pregnancy, childbirth and the puerperium: Secondary | ICD-10-CM | POA: Insufficient documentation

## 2017-10-21 DIAGNOSIS — O09893 Supervision of other high risk pregnancies, third trimester: Secondary | ICD-10-CM | POA: Insufficient documentation

## 2017-10-21 HISTORY — DX: Anxiety disorder, unspecified: F41.9

## 2017-10-26 LAB — OB RESULTS CONSOLE HIV ANTIBODY (ROUTINE TESTING): HIV: NONREACTIVE

## 2017-10-26 LAB — OB RESULTS CONSOLE HEPATITIS B SURFACE ANTIGEN: HEP B S AG: NEGATIVE

## 2017-10-26 LAB — OB RESULTS CONSOLE RUBELLA ANTIBODY, IGM: RUBELLA: IMMUNE

## 2017-10-26 LAB — OB RESULTS CONSOLE GC/CHLAMYDIA
CHLAMYDIA, DNA PROBE: NEGATIVE
Gonorrhea: NEGATIVE

## 2017-10-26 LAB — OB RESULTS CONSOLE ABO/RH: RH TYPE: POSITIVE

## 2017-10-26 LAB — OB RESULTS CONSOLE RPR: RPR: NONREACTIVE

## 2017-10-26 LAB — OB RESULTS CONSOLE ANTIBODY SCREEN: ANTIBODY SCREEN: NEGATIVE

## 2017-12-18 ENCOUNTER — Encounter (HOSPITAL_COMMUNITY): Payer: Self-pay | Admitting: *Deleted

## 2017-12-18 ENCOUNTER — Inpatient Hospital Stay (HOSPITAL_COMMUNITY)
Admission: AD | Admit: 2017-12-18 | Discharge: 2017-12-18 | Disposition: A | Discharge status: Home / Self Care | Payer: 59 | Source: Ambulatory Visit | Attending: Obstetrics & Gynecology | Admitting: Obstetrics & Gynecology

## 2017-12-18 DIAGNOSIS — R51 Headache: Secondary | ICD-10-CM | POA: Insufficient documentation

## 2017-12-18 DIAGNOSIS — O162 Unspecified maternal hypertension, second trimester: Secondary | ICD-10-CM | POA: Insufficient documentation

## 2017-12-18 DIAGNOSIS — O99212 Obesity complicating pregnancy, second trimester: Secondary | ICD-10-CM | POA: Insufficient documentation

## 2017-12-18 DIAGNOSIS — O99342 Other mental disorders complicating pregnancy, second trimester: Secondary | ICD-10-CM | POA: Diagnosis not present

## 2017-12-18 DIAGNOSIS — O99012 Anemia complicating pregnancy, second trimester: Secondary | ICD-10-CM | POA: Insufficient documentation

## 2017-12-18 DIAGNOSIS — O26892 Other specified pregnancy related conditions, second trimester: Secondary | ICD-10-CM | POA: Diagnosis present

## 2017-12-18 DIAGNOSIS — O9989 Other specified diseases and conditions complicating pregnancy, childbirth and the puerperium: Secondary | ICD-10-CM

## 2017-12-18 DIAGNOSIS — E669 Obesity, unspecified: Secondary | ICD-10-CM | POA: Diagnosis not present

## 2017-12-18 DIAGNOSIS — G479 Sleep disorder, unspecified: Secondary | ICD-10-CM | POA: Diagnosis present

## 2017-12-18 DIAGNOSIS — F419 Anxiety disorder, unspecified: Secondary | ICD-10-CM | POA: Diagnosis not present

## 2017-12-18 DIAGNOSIS — Z3A21 21 weeks gestation of pregnancy: Secondary | ICD-10-CM | POA: Insufficient documentation

## 2017-12-18 DIAGNOSIS — F410 Panic disorder [episodic paroxysmal anxiety] without agoraphobia: Secondary | ICD-10-CM | POA: Insufficient documentation

## 2017-12-18 HISTORY — DX: Essential (primary) hypertension: I10

## 2017-12-18 HISTORY — DX: Anxiety disorder, unspecified: F41.9

## 2017-12-18 MED ORDER — HYDROXYZINE HCL 25 MG PO TABS: 25.0000 mg | ORAL_TABLET | Freq: Four times a day (QID) | ORAL | 0 refills | Status: DC | PRN

## 2017-12-18 NOTE — Discharge Instructions (Signed)
Panic Attack A panic attack is a sudden episode of severe anxiety, fear, or discomfort that causes physical and emotional symptoms. The attack may be in response to something frightening, or it may occur for no known reason. Symptoms of a panic attack can be similar to symptoms of a heart attack or stroke. It is important to see your health care provider when you have a panic attack so that these conditions can be ruled out. A panic attack is a symptom of another condition. Most panic attacks go away with treatment of the underlying problem. If you have panic attacks often, you may have a condition called panic disorder. What are the causes? A panic attack may be caused by:  An extreme, life-threatening situation, such as a war or natural disaster.  An anxiety disorder, such as post-traumatic stress disorder.  Depression.  Certain medical conditions, including heart problems, neurological conditions, and infections.  Certain over-the-counter and prescription medicines.  Illegal drugs that increase heart rate and blood pressure, such as methamphetamine.  Alcohol.  Supplements that increase anxiety.  Panic disorder.  What increases the risk? You are more likely to develop this condition if:  You have an anxiety disorder.  You have another mental health condition.  You take certain medicines.  You use alcohol, illegal drugs, or other substances.  You are under extreme stress.  A life event is causing increased feelings of anxiety and depression.  What are the signs or symptoms? A panic attack starts suddenly, usually lasts about 20 minutes, and occurs with one or more of the following:  A pounding heart.  A feeling that your heart is beating irregularly or faster than normal (palpitations).  Sweating.  Trembling or shaking.  Shortness of breath or feeling smothered.  Feeling choked.  Chest pain or discomfort.  Nausea or a strange feeling in your  stomach.  Dizziness, feeling lightheaded, or feeling like you might faint.  Chills or hot flashes.  Numbness or tingling in your lips, hands, or feet.  Feeling confused, or feeling that you are not yourself.  Fear of losing control or being emotionally unstable.  Fear of dying.  How is this diagnosed? A panic attack is diagnosed with an assessment by your health care provider. During the assessment your health care provider will ask questions about:  Your history of anxiety, depression, and panic attacks.  Your medical history.  Whether you drink alcohol, use illegal drugs, take supplements, or take medicines. Be honest about your substance use.  Your health care provider may also:  Order blood tests or other kinds of tests to rule out serious medical conditions.  Refer you to a mental health professional for further evaluation.  How is this treated? Treatment depends on the cause of the panic attack:  If the cause is a medical problem, your health care provider will either treat that problem or refer you to a specialist.  If the cause is emotional, you may be given anti-anxiety medicines or referred to a counselor. These medicines may reduce how often attacks happen, reduce how severe the attacks are, and lower anxiety.  If the cause is a medicine, your health care provider may tell you to stop the medicine, change your dose, or take a different medicine.  If the cause is a drug, treatment may involve letting the drug wear off and taking medicine to help the drug leave your body or to counteract its effects. Attacks caused by drug abuse may continue even if you stop using  the drug.  Follow these instructions at home:  Take over-the-counter and prescription medicines only as told by your health care provider.  If you feel anxious, limit your caffeine intake.  Take good care of your physical and mental health by: ? Eating a balanced diet that includes plenty of fresh  fruits and vegetables, whole grains, lean meats, and low-fat dairy. ? Getting plenty of rest. Try to get 7-8 hours of uninterrupted sleep each night. ? Exercising regularly. Try to get 30 minutes of physical activity at least 5 days a week. ? Not smoking. Talk to your health care provider if you need help quitting. ? Limiting alcohol intake to no more than 1 drink a day for nonpregnant women and 2 drinks a day for men. One drink equals 12 oz of beer, 5 oz of wine, or 1 oz of hard liquor.  Keep all follow-up visits as told by your health care provider. This is important. Panic attacks may have underlying physical or emotional problems that take time to accurately diagnose. Contact a health care provider if:  Your symptoms do not improve, or they get worse.  You are not able to take your medicine as prescribed because of side effects. Get help right away if:  You have serious thoughts about hurting yourself or others.  You have symptoms of a panic attack. Do not drive yourself to the hospital. Have someone else drive you or call an ambulance. If you ever feel like you may hurt yourself or others, or you have thoughts about taking your own life, get help right away. You can go to your nearest emergency department or call:  Your local emergency services (911 in the [place]  A suicide crisis helpline, such as the National Suicide Prevention Lifeline at (223)555-7846. This is open 24 hours a day.  Summary  A panic attack is a sign of a serious health or mental health condition. Get help right away. Do not drive yourself to the hospital. Have someone else drive you or call an ambulance.  Always see a health care provider to have the reasons for the panic attack correctly diagnosed.  If your panic attack was caused by a physical problem, follow your health care provider's suggestions for medicine, referral to a specialist, and lifestyle changes.  If your panic attack was caused by an  emotional problem, follow through with counseling from a qualified mental health specialist.  If you feel like you may hurt yourself or others, call 911 and get help right away. This information is not intended to replace advice given to you by your health care provider. Make sure you discuss any questions you have with your health care provider. Document Released: 07/12/2005 Document Revised: 08/20/2016 Document Reviewed: 08/20/2016 Elsevier Interactive Patient Education  2018 [employer]   Perinatal Anxiety When a woman feels excessive tension or worry (anxiety) during pregnancy or during the first 12 months after she gives birth, she has a condition called perinatal anxiety. Anxiety can interfere with work, school, relationships, and other everyday activities. If it is not managed properly, it can also cause problems in the mother and her baby.  If you are pregnant and you have symptoms of an anxiety disorder, it is important to talk with your health care provider. What are the causes? The exact cause of this condition is not known. Hormonal changes during and after pregnancy may play a role in causing perinatal anxiety. What increases the risk? You are more likely to develop this condition  if:  You have a personal or family history of depression, anxiety, or mood disorders.  You experience a stressful life event during pregnancy, such as the death of a loved one.  You have a lot of regular life stress, such as being a single parent.  You have thyroid problems.  What are the signs or symptoms? Perinatal anxiety can be different for everyone. It may include:  Panic attacks (panic disorder). These are intense episodes of fear or discomfort that may also cause sweating, nausea, shortness of breath, or fear of dying. They usually last 5-15 minutes.  Reliving an upsetting (traumatic) event through distressing thoughts, dreams, or flashbacks (post-traumatic stress disorder, or  PTSD).  Excessive worry about multiple problems (generalized anxiety disorder).  Fear and stress about leaving certain people or loved ones (separation anxiety).  Performing repetitive tasks (compulsions) to relieve stress or worry (obsessive compulsive disorder, or OCD).  Fear of certain objects or situations (phobias).  Excessive worrying, such as a constant feeling that something bad is going to happen.  Inability to relax.  Difficulty concentrating.  Sleep problems.  Frequent nightmares or disturbing thoughts.  How is this diagnosed? This condition is diagnosed based on a physical exam and mental evaluation. In some cases, your health care provider may use an anxiety screening tool. These tools include a list of questions that can help a health care provider diagnose anxiety. Your health care provider may refer you to a mental health expert who specializes in anxiety. How is this treated? This condition may be treated with:  Medicines. Your health care provider will only give you medicines that have been proven safe for pregnancy and breastfeeding.  Talk therapy with a mental health professional to help change your patterns of thinking (cognitive behavioral therapy).  Mindfulness-based stress reduction.  Other relaxation therapies, such as deep breathing or guided muscle relaxation.  Support groups.  Follow these instructions at home: Lifestyle  Do not use any products that contain nicotine or tobacco, such as cigarettes and e-cigarettes. If you need help quitting, ask your health care provider.  Do not use alcohol when you are pregnant. After your baby is born, limit alcohol intake to no more than 1 drink a day. One drink equals 12 oz of beer, 5 oz of wine, or 1 oz of hard liquor.  Consider joining a support group for new mothers. Ask your health care provider for recommendations.  Take good care of yourself. Make sure you: ? Get plenty of sleep. If you are having  trouble sleeping, talk with your health care provider. ? Eat a healthy diet. This includes plenty of fruits and vegetables, whole grains, and lean proteins. ? Exercise regularly, as told by your health care provider. Ask your health care provider what exercises are safe for you. General instructions  Take over-the-counter and prescription medicines only as told by your health care provider.  Talk with your partner or family members about your feelings during pregnancy. Share any concerns or fears that you may have.  Ask for help with tasks or chores when you need it. Ask friends and family members to provide meals, watch your children, or help with cleaning.  Keep all follow-up visits as told by your health care provider. This is important. Contact a health care provider if:  You (or people close to you) notice that you have any symptoms of anxiety or depression.  You have anxiety and your symptoms get worse.  You experience side effects from medicines, such as  nausea or sleep problems. Get help right away if:  You feel like hurting yourself, your baby, or someone else. If you ever feel like you may hurt yourself or others, or have thoughts about taking your own life, get help right away. You can go to your nearest emergency department or call:  Your local emergency services (911 in the [place]  A suicide crisis helpline, such as the National Suicide Prevention Lifeline at 415-411-0090. This is open 24 hours a day.  Summary  Perinatal anxiety is when a woman feels excessive tension or worry during pregnancy or during the first 12 months after she gives birth.  Perinatal anxiety may include panic attacks, post-traumatic stress disorder, separation anxiety, phobias, or generalized anxiety.  Perinatal anxiety can cause physical health problems in the mother and baby if not properly managed.  This condition is treated with medicines, talk therapy, stress reduction therapies, or a  combination of two or more treatments.  Talk with your partner or family members about your concerns or fears. Do not be afraid to ask for help. This information is not intended to replace advice given to you by your health care provider. Make sure you discuss any questions you have with your health care provider. Document Released: 09/08/2016 Document Revised: 09/08/2016 Document Reviewed: 09/08/2016 Elsevier Interactive Patient Education  [employer].

## 2017-12-18 NOTE — MAU Note (Signed)
Unable to sleep for last 3-4 nights. Sleeps a short while and awakens in a panic. Has had panic attack one other time in her life. Does have slight h/a which she thinks is due to lack of sleep

## 2017-12-18 NOTE — MAU Provider Note (Signed)
Chief Complaint:  Panic Attack   First Provider Initiated Contact with Patient 12/18/17 0446     HPI: Annette Sullivan is a 32 y.o. [id number] at 38w1dwho presents to maternity admissions reporting sleeping difficulty for several nights. Wakes up in a panic and cannot get back to sleep. Worried it will hurt the baby.. She reports good fetal movement, denies LOF, vaginal bleeding, vaginal itching/burning, urinary symptoms, h/a, dizziness, n/v, diarrhea, constipation or fever/chills. .   Other  This is a new problem. The current episode started in the past 7 days. The problem occurs intermittently. The problem has been unchanged. Associated symptoms include headaches. Pertinent negatives include no abdominal pain, chills, fever, myalgias, nausea, urinary symptoms or visual change. Nothing aggravates the symptoms. She has tried nothing for the symptoms.   RN Note: Unable to sleep for last 3-4 nights. Sleeps a short while and awakens in a panic. Has had panic attack one other time in her life. Does have slight h/a which she thinks is due to lack of sleep    Past Medical History: Past Medical History:  Diagnosis Date  . Anemia   . Anxiety   . Headache(784.0)   . Hypertension   . No pertinent past medical history   . Obesity (BMI 30-39.9)     Past obstetric history: OB History  Gravida Para Term Preterm AB Living  0 1 2  SAB TAB Ectopic Multiple Live Births  0 1 0 0 2    # Outcome Date GA Lbr Len/2nd Weight Sex Delivery Anes PTL Lv  4 Current           3 Term 08/24/12 [redacted]w[redacted]d 01:50 / 00:13 7 lb 11 oz (3.487 kg) M Vag-Spont Local, EPI  LIV     Birth Comments: caput  2 Term 2011 [redacted]w[redacted]d  7 lb 6 oz (3.345 kg) F Vag-Spont EPI  LIV  1 TAB 2010            Past Surgical History: Past Surgical History:  Procedure Laterality Date  . APPENDECTOMY    . INSERTION OF MESH N/A 12/18/2015   Procedure: INSERTION OF MESH;  Surgeon: Abigail Miyamoto, MD;  Location: [facility];  Service: General;   Laterality: N/A;  . UMBILICAL HERNIA REPAIR N/A 12/18/2015   Procedure: HERNIA REPAIR UMBILICAL ADULT WITH MESH ;  Surgeon: Abigail Miyamoto, MD;  Location: [facility] OR;  Service: General;  Laterality: N/A;    Family History: Family History  Problem Relation Age of Onset  . Anesthesia problems Neg Hx     Social History: Social History   Tobacco Use  . Smoking status: Never Smoker  . Smokeless tobacco: Never Used  Substance Use Topics  . Alcohol use: No  . Drug use: No    Allergies: No Known Allergies  Meds:  Medications Prior to Admission  Medication Sig Dispense Refill Last Dose  . Prenatal Vit-Fe Fumarate-FA (PRENATAL MULTIVITAMIN) TABS tablet Take 1 tablet by mouth daily at 12 noon.   12/17/2017 at Unknown time  . acetaminophen (TYLENOL) 325 MG tablet Take 650 mg by mouth every 6 (six) hours as needed for mild pain or moderate pain.   12/22/2015  . HYDROcodone-acetaminophen (NORCO) 5-325 MG tablet Take 1-2 tablets by mouth every 6 (six) hours as needed for moderate pain. 40 tablet 0 12/21/2015  . Multiple Vitamins-Minerals (MULTI-VITAMIN GUMMIES) CHEW Chew 2 each by mouth daily.   Past Month at Unknown time  . ondansetron (ZOFRAN ODT) 4 MG disintegrating tablet   ODT q4 hours prn nausea/vomit 15 tablet 0 More than a month at Unknown time    I have reviewed patient's Past Medical Hx, Surgical Hx, Family Hx, Social Hx, medications and allergies.   ROS:  Review of Systems  Constitutional: Negative for chills and fever.  Gastrointestinal: Negative for abdominal pain and nausea.  Musculoskeletal: Negative for myalgias.  Neurological: Positive for headaches.   Other systems negative  Physical Exam   Patient Vitals for the past 24 hrs:  BP Temp Pulse Resp SpO2 Height Weight  12/18/17 0414 134/65 - 82 - - - -  12/18/17 0355 140/74 98 F (36.7 C) 87 18 100 %  (1.626 m) 217 lb (98.4 kg)   Constitutional: Well-developed, well-nourished female in no acute distress.   Cardiovascular: normal rate and rhythm Respiratory: normal effort, clear to auscultation bilaterally GI: Abd soft, non-tender, gravid appropriate for gestational age.   No rebound or guarding. MS: Extremities nontender, no edema, normal ROM Neurologic: Alert and oriented x 4.  GU: Neg CVAT.  PELVIC EXAM:  deferred  FHT:   150  Labs:   No results found for this or any previous visit (from the past 24 hour(s)).   Imaging:  Bedside informal [place] Fetus active Placenta anterior FHR 150s Normal gross appearance  MAU Course/MDM: I did a bedside US to reassure patient that her baby was doing well Discussed anxiety, panic attacks.  Possible link to supine position.  Discussed use of Vistaril and relaxation. Consult Dr Richardson Dopp with presentation, exam findings     Recommends followup in office Treatments in MAU included none except [place].    Assessment: Single IUP at [redacted]w[redacted]d Panic attacks, anxiety   Plan: Discharge home Rx Vistaril for PRN use Follow up in Office for prenatal visits and recheck of status  Encouraged to return here or to other Urgent Care/ED if she develops worsening of symptoms, increase in pain, fever, or other concerning symptoms.   Pt stable at time of discharge.  Wynelle Bourgeois CNM, MSN Certified Nurse-Midwife 12/18/2017 4:46 AM

## 2017-12-18 NOTE — Progress Notes (Signed)
Written and verbal d/c instructions given and understanding voiced. 

## 2018-01-09 ENCOUNTER — Other Ambulatory Visit: Payer: Self-pay

## 2018-01-09 ENCOUNTER — Encounter (HOSPITAL_COMMUNITY): Payer: Self-pay | Admitting: *Deleted

## 2018-01-09 ENCOUNTER — Inpatient Hospital Stay (HOSPITAL_COMMUNITY)
Admission: AD | Admit: 2018-01-09 | Discharge: 2018-01-09 | Disposition: A | Discharge status: Home / Self Care | Payer: 59 | Source: Ambulatory Visit | Attending: Obstetrics and Gynecology | Admitting: Obstetrics and Gynecology

## 2018-01-09 ENCOUNTER — Inpatient Hospital Stay (HOSPITAL_COMMUNITY): Payer: 59

## 2018-01-09 DIAGNOSIS — O26872 Cervical shortening, second trimester: Secondary | ICD-10-CM | POA: Diagnosis not present

## 2018-01-09 DIAGNOSIS — N883 Incompetence of cervix uteri: Secondary | ICD-10-CM

## 2018-01-09 DIAGNOSIS — Z3A24 24 weeks gestation of pregnancy: Secondary | ICD-10-CM | POA: Diagnosis not present

## 2018-01-09 DIAGNOSIS — N949 Unspecified condition associated with female genital organs and menstrual cycle: Secondary | ICD-10-CM

## 2018-01-09 LAB — WET PREP, GENITAL
CLUE CELLS WET PREP: NONE SEEN
Sperm: NONE SEEN
Trich, Wet Prep: NONE SEEN
Yeast Wet Prep HPF POC: NONE SEEN

## 2018-01-09 LAB — URINALYSIS, ROUTINE W REFLEX MICROSCOPIC
Bilirubin Urine: NEGATIVE
Glucose, UA: NEGATIVE mg/dL
Hgb urine dipstick: NEGATIVE
Ketones, ur: NEGATIVE mg/dL
Nitrite: NEGATIVE
Protein, ur: NEGATIVE mg/dL
Specific Gravity, Urine: 1.026 (ref 1.005–1.030)
pH: 5 (ref 5.0–8.0)

## 2018-01-09 LAB — FETAL FIBRONECTIN: FETAL FIBRONECTIN: NEGATIVE

## 2018-01-09 MED ORDER — BETAMETHASONE SOD PHOS & ACET 6 (3-3) MG/ML IJ SUSP
12.0000 mg | INTRAMUSCULAR | Status: DC
Start: 2018-01-09 — End: 2018-01-09
  Administered 2018-01-09: 12 mg via INTRAMUSCULAR
  Filled 2018-01-09: qty 2

## 2018-01-09 MED ORDER — PROGESTERONE MICRONIZED 200 MG PO CAPS: 200.0000 mg | ORAL_CAPSULE | Freq: Every day | ORAL | 4 refills | Status: DC

## 2018-01-09 NOTE — MAU Note (Signed)
Pt reports contractions a couple of weeks ago. Was started on procardia on wednesday last week. Also had US in office and pt reports a shortened cervix, was to followed up Wednesday for a report US. Pt noticed spotting this morning. 2 days ago the pt started to have some vaginal itching and burning w/ urination

## 2018-01-09 NOTE — Discharge Instructions (Signed)
Preterm Labor and Birth Information Pregnancy normally lasts 39-41 weeks. Preterm labor is when labor starts early. It starts before you have been pregnant for 37 whole weeks. What are the risk factors for preterm labor? Preterm labor is more likely to occur in women who:  Have an infection while pregnant.  Have a cervix that is short.  Have gone into preterm labor before.  Have had surgery on their cervix.  Are younger than age 32.  Are older than age 35.  Are African American.  Are pregnant with two or more babies.  Take street drugs while pregnant.  Smoke while pregnant.  Do not gain enough weight while pregnant.  Got pregnant right after another pregnancy.  What are the symptoms of preterm labor? Symptoms of preterm labor include:  Cramps. The cramps may feel like the cramps some women get during their period. The cramps may happen with watery poop (diarrhea).  Pain in the belly (abdomen).  Pain in the lower back.  Regular contractions or tightening. It may feel like your belly is getting tighter.  Pressure in the lower belly that seems to get stronger.  More fluid (discharge) leaking from the vagina. The fluid may be watery or bloody.  Water breaking.  Why is it important to notice signs of preterm labor? Babies who are born early may not be fully developed. They have a higher chance for:  Long-term heart problems.  Long-term lung problems.  Trouble controlling body systems, like breathing.  Bleeding in the brain.  A condition called cerebral palsy.  Learning difficulties.  Death.  These risks are highest for babies who are born before 34 weeks of pregnancy. How is preterm labor treated? Treatment depends on:  How long you were pregnant.  Your condition.  The health of your baby.  Treatment may involve:  Having a stitch (suture) placed in your cervix. When you give birth, your cervix opens so the baby can come out. The stitch keeps the  cervix from opening too soon.  Staying at the hospital.  Taking or getting medicines, such as: ? Hormone medicines. ? Medicines to stop contractions. ? Medicines to help the baby's lungs develop. ? Medicines to prevent your baby from having cerebral palsy.  What should I do if I am in preterm labor? If you think you are going into labor too soon, call your doctor right away. How can I prevent preterm labor?  Do not use any tobacco products. ? Examples of these are cigarettes, chewing tobacco, and e-cigarettes. ? If you need help quitting, ask your doctor.  Do not use street drugs.  Do not use any medicines unless you ask your doctor if they are safe for you.  Talk with your doctor before taking any herbal supplements.  Make sure you gain enough weight.  Watch for infection. If you think you might have an infection, get it checked right away.  If you have gone into preterm labor before, tell your doctor. This information is not intended to replace advice given to you by your health care provider. Make sure you discuss any questions you have with your health care provider. Document Released: 10/08/2008 Document Revised: 12/23/2015 Document Reviewed: 12/03/2015 Elsevier Interactive Patient Education  2018 Elsevier Inc[employer] Preventing Preterm Birth Preterm birth is when your baby is delivered between 20 weeks and 37 weeks of pregnancy. A full-term pregnancy lasts for at least 37 weeks. Preterm birth can be dangerous for your baby because the last few weeks of pregnancy are   an important time for your baby's brain and lungs to grow. Many things can cause a baby to be born early. Sometimes the cause is not known. There are certain factors that make you more likely to experience preterm birth, such as:  Having a previous baby born preterm.  Being pregnant with twins or other multiples.  Having had fertility treatment.  Being overweight or underweight at the start of your  pregnancy.  Having any of the following during pregnancy: ? An infection, including a urinary tract infection (UTI) or an STI (sexually transmitted infection). ? High blood pressure. ? Diabetes. ? Vaginal bleeding.  Being age 35 or older.  Being age 18 or younger.  Getting pregnant within 6 months of a previous pregnancy.  Suffering extreme stress or physical or emotional abuse during pregnancy.  Standing for long periods of time during pregnancy, such as working at a job that requires standing.  What are the risks? The most serious risk of preterm birth is that the baby may not survive. This is more likely to happen if a baby is born before 34 weeks. Other risks and complications of preterm birth may include your baby having:  Breathing problems.  Brain damage that affects movement and coordination (cerebral palsy).  Feeding difficulties.  Vision or hearing problems.  Infections or inflammation of the digestive tract (colitis).  Developmental delays.  Learning disabilities.  Higher risk for diabetes, heart disease, and high blood pressure later in life.  What can I do to lower my risk? Medical care  The most important thing you can do to lower your risk for preterm birth is to get routine medical care during pregnancy (prenatal care). If you have a high risk of preterm birth, you may be referred to a health care provider who specializes in managing high-risk pregnancies (perinatologist). You may be given medicine to help prevent preterm birth. Lifestyle changes Certain lifestyle changes can also lower your risk of preterm birth:  Wait at least 6 months after a pregnancy to become pregnant again.  Try to plan pregnancy for when you are between 19 and 35 years old.  Get to a healthy weight before getting pregnant. If you are overweight, work with your health care provider to safely lose weight.  Do not use any products that contain nicotine or tobacco, such as  cigarettes and e-cigarettes. If you need help quitting, ask your health care provider.  Do not drink alcohol.  Do not use drugs.  Where to find support: For more support, consider:  Talking with your health care provider.  Talking with a therapist or substance abuse counselor, if you need help quitting.  Working with a diet and nutrition specialist (dietitian) or a personal trainer to maintain a healthy weight.  Joining a support group.  Where to find more information: Learn more about preventing preterm birth from:  Centers for Disease Control and Prevention: cdc.gov/reproductivehealth/maternalinfanthealth/pretermbirth.htm  March of Dimes: marchofdimes.org/complications/premature-babies.aspx  American Pregnancy Association: americanpregnancy.org/labor-and-birth/premature-labor  Contact a health care provider if:  You have any of the following signs of preterm labor before 37 weeks: ? A change or increase in vaginal discharge. ? Fluid leaking from your vagina. ? Pressure or cramps in your lower abdomen. ? A backache that does not go away or gets worse. ? Regular tightening (contractions) in your lower abdomen. Summary  Preterm birth means having your baby during weeks 20-37 of pregnancy.  Preterm birth may put your baby at risk for physical and mental problems.  Getting good   prenatal care can help prevent preterm birth.  You can lower your risk of preterm birth by making certain lifestyle changes, such as not smoking and not using alcohol. This information is not intended to replace advice given to you by your health care provider. Make sure you discuss any questions you have with your health care provider. Document Released: 08/26/2015 Document Revised: 03/20/2016 Document Reviewed: 03/20/2016 Elsevier Interactive Patient Education  2018 Elsevier Inc.  

## 2018-01-09 NOTE — MAU Provider Note (Addendum)
Chief Complaint:  Abdominal Pain; Vaginal Bleeding; and Vaginal Itching   First Provider Initiated Contact with Patient 01/09/18 0708     HPI: Annette Sullivan is a 32 y.o. [id number] at [redacted]w[redacted]d who presents to maternity admissions reporting irregular cramping. Pt stated cramps have been occurring for 1.5 weeks, sharp, shooting worse with movement, pt endorses taking procardia 10mg  Q6H for cramps, pt also endorses having an [place] at [facility] with short cervix of 3.1-3.5cm and funneling. Pt was due to an [place] in [facility] in 2 days. Denies, leakage of fluid. Pt endorses itching vagina, mild increase in discharge, no oder, no pain, no fever,, but endorses having a couple spots of blood x1 day ago, with no intercourse or trauma. Good fetal movement.   Pregnancy Course:   Past Medical History:  Diagnosis Date  . Anemia   . Anxiety   . Headache(784.0)   . Hypertension   . No pertinent past medical history   . Obesity (BMI 30-39.9)    OB History  Gravida Para Term Preterm AB Living  4 2 2  0 1 2  SAB TAB Ectopic Multiple Live Births  0 1 0 0 2    # Outcome Date GA Lbr Len/2nd Weight Sex Delivery Anes PTL Lv  4 Current           3 Term 08/24/12 [redacted]w[redacted]d 01:50 / 00:13 3.487 kg (7 lb 11 oz) M Vag-Spont Local, EPI  LIV     Birth Comments: caput  2 Term 2011 [redacted]w[redacted]d  3.345 kg (7 lb 6 oz) F Vag-Spont EPI  LIV  1 TAB 2010           Past Surgical History:  Procedure Laterality Date  . APPENDECTOMY    . INSERTION OF MESH N/A 12/18/2015   Procedure: INSERTION OF MESH;  Surgeon: Abigail Miyamoto, MD;  Location: [facility] OR;  Service: General;  Laterality: N/A;  . UMBILICAL HERNIA REPAIR N/A 12/18/2015   Procedure: HERNIA REPAIR UMBILICAL ADULT WITH MESH ;  Surgeon: Abigail Miyamoto, MD;  Location: [facility] OR;  Service: General;  Laterality: N/A;   Family History  Problem Relation Age of Onset  . Anesthesia problems Neg Hx    Social History   Tobacco Use  . Smoking status: Never Smoker  . Smokeless tobacco: Never Used  Substance  Use Topics  . Alcohol use: No  . Drug use: No   No Known Allergies Medications Prior to Admission  Medication Sig Dispense Refill Last Dose  . NIFEdipine (PROCARDIA) 10 MG capsule Take 10 mg by mouth 3 (three) times daily.   01/08/2018 at Unknown time  . acetaminophen (TYLENOL) 325 MG tablet Take 650 mg by mouth every 6 (six) hours as needed for mild pain or moderate pain.   12/22/2015  . HYDROcodone-acetaminophen (NORCO) 5-325 MG tablet Take 1-2 tablets by mouth every 6 (six) hours as needed for moderate pain. 40 tablet 0 12/21/2015  . hydrOXYzine (ATARAX/VISTARIL) 25 MG tablet Take 1 tablet (25 mg total) by mouth every 6 (six) hours as needed for itching. 30 tablet 0   . ondansetron (ZOFRAN ODT) 4 MG disintegrating tablet 4mg  ODT q4 hours prn nausea/vomit 15 tablet 0 More than a month at Unknown time  . Prenatal Vit-Fe Fumarate-FA (PRENATAL MULTIVITAMIN) TABS tablet Take 1 tablet by mouth daily at 12 noon.   12/17/2017 at Unknown time    I have reviewed patient's Past Medical Hx, Surgical Hx, Family Hx, Social Hx, medications and allergies.   ROS:  Review  of Systems  Gastrointestinal: Positive for abdominal pain.       Unilateral, acute lower abdominal pain lasts approximately 1 second, sometimes triggered by movement   Genitourinary:       Vaginal pruritis   Neurological: Speech difficulty:   All other systems reviewed and are negative.   Physical Exam   Patient Vitals for the past 24 hrs:  BP Temp Pulse Resp SpO2 Height Weight  01/09/18 0619 125/72 98.3 F (36.8 C) 87 18 100 % 5\' 4"  (1.626 m) 98.4 kg (217 lb)   Constitutional: Well-developed, well-nourished female in no acute distress.  Cardiovascular: normal rate Respiratory: normal effort GI: Abd soft, non-tender, gravid appropriate for gestational age. Pos BS x 4 MS: Extremities nontender, no edema, normal ROM Neurologic: Alert and oriented x 4.  GU: Neg CVAT.  Pelvic: physiologic discharge, no blood. Pelvic adequate for  labor. No CMT  Dilation: Closed Effacement (%): 50 Cervical Position: Posterior Exam by:: Monatana, J NP   NST: FHR baseline 145 bpm, Variability: moderate, Accelerations:not present, Decelerations:  Absent. Cat 1 FHTs, NST not done due to gestational age  UC:   none SVE:   Dilation: Closed Effacement (%): 50 Exam by:: Valere DrossMonatana, J NP, .     Labs: Results for orders placed or performed during the hospital encounter of 01/09/18 (from the past 24 hour(s))  Urinalysis, Routine w reflex microscopic     Status: Abnormal   Collection Time: 01/09/18  6:25 AM  Result Value Ref Range   Color, Urine YELLOW YELLOW   APPearance HAZY (A) CLEAR   Specific Gravity, Urine 1.026 1.005 - 1.030   pH 5.0 5.0 - 8.0   Glucose, UA NEGATIVE NEGATIVE mg/dL   Hgb urine dipstick NEGATIVE NEGATIVE   Bilirubin Urine NEGATIVE NEGATIVE   Ketones, ur NEGATIVE NEGATIVE mg/dL   Protein, ur NEGATIVE NEGATIVE mg/dL   Nitrite NEGATIVE NEGATIVE   Leukocytes, UA MODERATE (A) NEGATIVE   RBC / HPF 0-5 0 - 5 RBC/hpf   WBC, UA 0-5 0 - 5 WBC/hpf   Bacteria, UA RARE (A) NONE SEEN   Squamous Epithelial / LPF 0-5 0 - 5   Mucus PRESENT   Wet prep, genital     Status: Abnormal   Collection Time: 01/09/18  6:57 AM  Result Value Ref Range   Yeast Wet Prep HPF POC NONE SEEN NONE SEEN   Trich, Wet Prep NONE SEEN NONE SEEN   Clue Cells Wet Prep HPF POC NONE SEEN NONE SEEN   WBC, Wet Prep HPF POC FEW (A) NONE SEEN   Sperm NONE SEEN   Fetal fibronectin     Status: None   Collection Time: 01/09/18  6:57 AM  Result Value Ref Range   Fetal Fibronectin NEGATIVE NEGATIVE    Imaging:  Ultrasound today: SIUP, breech, AFI WNL, cervix measures 2.4cm transvaginally with internal funneling noted.   MAU Course: Orders Placed This Encounter  Procedures  . Wet prep, genital  . US MFM OB Transvaginal  . Urinalysis, Routine w reflex microscopic  . Fetal fibronectin   Meds ordered this encounter  Medications  . betamethasone  acetate-betamethasone sodium phosphate (CELESTONE) injection 12 mg  . progesterone (PROMETRIUM) 200 MG capsule    Sig: Place 1 capsule (200 mg total) vaginally at bedtime.    Dispense:  30 capsule    Refill:  4    Order Specific Question:   Supervising Provider    Answer:   Osborn CohoOBERTS, ANGELA [[id number]]    MDM: No  signs of UTI or vaginal infection. Patient's "cramping" is consistent with round liagement pain, patient is not contracting. Cervix has shortened further since [place] on the 12th (3.1/3.5-->2.4cm). Dr. Su Hilt consulted, patient to receive treatment outpatient. Patient to stop work as she has a job which causes her to remain on her feet all day.   Assessment: 1. Round ligament pain   2. Short cervix    Plan: 32 y.o. G4P2 at [redacted]w[redacted]d gestation Short cervix- 2.4cm Betamethasone outpatient, first dose this am at 0900, patient to return to MAU tomorrow at 0900 for second dose and verbalizes understanding Vaginal progesterone 200mg  QHS Keep appointment at office on Wednesday for ROB and U/S  Preterm labor precautions, education provided and handout given Pelvic rest and limited activity, education provided and patient verbalizes understanding.   [facility] NP-C, CNM 01/09/2018 9:05 AM  Janeece Riggers  9:11 AM 01/09/18

## 2018-01-10 ENCOUNTER — Inpatient Hospital Stay (HOSPITAL_COMMUNITY)
Admission: AD | Admit: 2018-01-10 | Discharge: 2018-01-10 | Disposition: A | Discharge status: Home / Self Care | Payer: 59 | Source: Ambulatory Visit | Attending: Obstetrics and Gynecology | Admitting: Obstetrics and Gynecology

## 2018-01-10 DIAGNOSIS — O26872 Cervical shortening, second trimester: Secondary | ICD-10-CM | POA: Diagnosis present

## 2018-01-10 DIAGNOSIS — Z3A24 24 weeks gestation of pregnancy: Secondary | ICD-10-CM | POA: Insufficient documentation

## 2018-01-10 MED ORDER — BETAMETHASONE SOD PHOS & ACET 6 (3-3) MG/ML IJ SUSP
12.0000 mg | Freq: Once | INTRAMUSCULAR | Status: AC
  Administered 2018-01-10: 12 mg via INTRAMUSCULAR
  Filled 2018-01-10: qty 2

## 2018-01-23 DIAGNOSIS — Z302 Encounter for sterilization: Secondary | ICD-10-CM | POA: Insufficient documentation

## 2018-03-27 ENCOUNTER — Encounter (HOSPITAL_COMMUNITY): Payer: Self-pay

## 2018-03-27 ENCOUNTER — Inpatient Hospital Stay (HOSPITAL_COMMUNITY)
Admission: AD | Admit: 2018-03-27 | Discharge: 2018-03-27 | Disposition: A | Discharge status: Home / Self Care | Payer: 59 | Source: Ambulatory Visit | Attending: Obstetrics and Gynecology | Admitting: Obstetrics and Gynecology

## 2018-03-27 DIAGNOSIS — O26893 Other specified pregnancy related conditions, third trimester: Secondary | ICD-10-CM | POA: Diagnosis present

## 2018-03-27 DIAGNOSIS — R300 Dysuria: Secondary | ICD-10-CM | POA: Insufficient documentation

## 2018-03-27 DIAGNOSIS — O479 False labor, unspecified: Secondary | ICD-10-CM

## 2018-03-27 DIAGNOSIS — Z3A35 35 weeks gestation of pregnancy: Secondary | ICD-10-CM | POA: Insufficient documentation

## 2018-03-27 DIAGNOSIS — O139 Gestational [pregnancy-induced] hypertension without significant proteinuria, unspecified trimester: Secondary | ICD-10-CM

## 2018-03-27 DIAGNOSIS — R109 Unspecified abdominal pain: Secondary | ICD-10-CM | POA: Diagnosis present

## 2018-03-27 DIAGNOSIS — O133 Gestational [pregnancy-induced] hypertension without significant proteinuria, third trimester: Secondary | ICD-10-CM | POA: Diagnosis not present

## 2018-03-27 LAB — URINALYSIS, ROUTINE W REFLEX MICROSCOPIC
BILIRUBIN URINE: NEGATIVE
Glucose, UA: NEGATIVE mg/dL
Hgb urine dipstick: NEGATIVE
Ketones, ur: 5 mg/dL — AB
Nitrite: NEGATIVE
Protein, ur: NEGATIVE mg/dL
SPECIFIC GRAVITY, URINE: 1.012 (ref 1.005–1.030)
pH: 6 (ref 5.0–8.0)

## 2018-03-27 LAB — COMPREHENSIVE METABOLIC PANEL
ALBUMIN: 2.9 g/dL — AB (ref 3.5–5.0)
ALK PHOS: 132 U/L — AB (ref 38–126)
ALT: 14 U/L (ref 0–44)
ANION GAP: 10 (ref 5–15)
AST: 16 U/L (ref 15–41)
BILIRUBIN TOTAL: 0.7 mg/dL (ref 0.3–1.2)
BUN: 8 mg/dL (ref 6–20)
CALCIUM: 8.9 mg/dL (ref 8.9–10.3)
CO2: 21 mmol/L — ABNORMAL LOW (ref 22–32)
CREATININE: 0.56 mg/dL (ref 0.44–1.00)
Chloride: 101 mmol/L (ref 98–111)
GFR calc non Af Amer: >60 mL/min (ref >60)
Glucose, Bld: 96 mg/dL (ref 70–99)
Potassium: 3.5 mmol/L (ref 3.5–5.1)
SODIUM: 132 mmol/L — AB (ref 135–145)
Total Protein: 5.9 g/dL — ABNORMAL LOW (ref 6.5–8.1)

## 2018-03-27 LAB — CBC WITH DIFFERENTIAL/PLATELET
BASOS PCT: 0 %
Basophils Absolute: 0 10*3/uL (ref 0.0–0.1)
EOS ABS: 0.2 10*3/uL (ref 0.0–0.7)
Eosinophils Relative: 3 %
HCT: 32.5 % — ABNORMAL LOW (ref 36.0–46.0)
HEMOGLOBIN: 10.5 g/dL — AB (ref 12.0–15.0)
Lymphocytes Relative: 32 %
Lymphs Abs: 2.4 10*3/uL (ref 0.7–4.0)
MCH: 26.8 pg (ref 26.0–34.0)
MCHC: 32.3 g/dL (ref 30.0–36.0)
MCV: 82.9 fL (ref 78.0–100.0)
MONOS PCT: 2 %
Monocytes Absolute: 0.2 10*3/uL (ref 0.1–1.0)
NEUTROS PCT: 63 %
Neutro Abs: 4.7 10*3/uL (ref 1.7–7.7)
Platelets: 214 10*3/uL (ref 150–400)
RBC: 3.92 MIL/uL (ref 3.87–5.11)
RDW: 14 % (ref 11.5–15.5)
WBC: 7.5 10*3/uL (ref 4.0–10.5)

## 2018-03-27 LAB — PROTEIN / CREATININE RATIO, URINE
CREATININE, URINE: 87 mg/dL
Protein Creatinine Ratio: 0.11 mg/mg{Cre} (ref 0.00–0.15)
TOTAL PROTEIN, URINE: 10 mg/dL

## 2018-03-27 LAB — URIC ACID: URIC ACID, SERUM: 4.8 mg/dL (ref 2.5–7.1)

## 2018-03-27 LAB — LACTATE DEHYDROGENASE: LDH: 114 U/L (ref 98–192)

## 2018-03-27 MED ORDER — NITROFURANTOIN MONOHYD MACRO 100 MG PO CAPS: 100.0000 mg | ORAL_CAPSULE | Freq: Two times a day (BID) | ORAL | 0 refills | Status: DC

## 2018-03-27 MED ORDER — NITROFURANTOIN MONOHYD MACRO 100 MG PO CAPS: 100.0000 mg | ORAL_CAPSULE | Freq: Two times a day (BID) | ORAL | 0 refills | Status: AC

## 2018-03-27 NOTE — Discharge Instructions (Signed)
Braxton Hicks Contractions °Contractions of the uterus can occur throughout pregnancy, but they are not always a sign that you are in labor. You may have practice contractions called Braxton Hicks contractions. These false labor contractions are sometimes confused with true labor. °What are Braxton Hicks contractions? °Braxton Hicks contractions are tightening movements that occur in the muscles of the uterus before labor. Unlike true labor contractions, these contractions do not result in opening (dilation) and thinning of the cervix. Toward the end of pregnancy (32-34 weeks), Braxton Hicks contractions can happen more often and may become stronger. These contractions are sometimes difficult to tell apart from true labor because they can be very uncomfortable. You should not feel embarrassed if you go to the hospital with false labor. °Sometimes, the only way to tell if you are in true labor is for your health care provider to look for changes in the cervix. The health care provider will do a physical exam and may monitor your contractions. If you are not in true labor, the exam should show that your cervix is not dilating and your water has not broken. °If there are other health problems associated with your pregnancy, it is completely safe for you to be sent home with false labor. You may continue to have Braxton Hicks contractions until you go into true labor. °How to tell the difference between true labor and false labor °True labor °· Contractions last 30-70 seconds. °· Contractions become very regular. °· Discomfort is usually felt in the top of the uterus, and it spreads to the lower abdomen and low back. °· Contractions do not go away with walking. °· Contractions usually become more intense and increase in frequency. °· The cervix dilates and gets thinner. °False labor °· Contractions are usually shorter and not as strong as true labor contractions. °· Contractions are usually irregular. °· Contractions  are often felt in the front of the lower abdomen and in the groin. °· Contractions may go away when you walk around or change positions while lying down. °· Contractions get weaker and are shorter-lasting as time goes on. °· The cervix usually does not dilate or become thin. °Follow these instructions at home: °· Take over-the-counter and prescription medicines only as told by your health care provider. °· Keep up with your usual exercises and follow other instructions from your health care provider. °· Eat and drink lightly if you think you are going into labor. °· If Braxton Hicks contractions are making you uncomfortable: °? Change your position from lying down or resting to walking, or change from walking to resting. °? Sit and rest in a tub of warm water. °? Drink enough fluid to keep your urine pale yellow. Dehydration may cause these contractions. °? Do slow and deep breathing several times an hour. °· Keep all follow-up prenatal visits as told by your health care provider. This is important. °Contact a health care provider if: °· You have a fever. °· You have continuous pain in your abdomen. °Get help right away if: °· Your contractions become stronger, more regular, and closer together. °· You have fluid leaking or gushing from your vagina. °· You pass blood-tinged mucus (bloody show). °· You have bleeding from your vagina. °· You have low back pain that you never had before. °· You feel your baby’s head pushing down and causing pelvic pressure. °· Your baby is not moving inside you as much as it used to. °Summary °· Contractions that occur before labor are called Braxton   Hicks contractions, false labor, or practice contractions. °· Braxton Hicks contractions are usually shorter, weaker, farther apart, and less regular than true labor contractions. True labor contractions usually become progressively stronger and regular and they become more frequent. °· Manage discomfort from Braxton Hicks contractions by  changing position, resting in a warm bath, drinking plenty of water, or practicing deep breathing. °This information is not intended to replace advice given to you by your health care provider. Make sure you discuss any questions you have with your health care provider. °Document Released: 11/25/2016 Document Revised: 11/25/2016 Document Reviewed: 11/25/2016 °Elsevier Interactive Patient Education © 2018 Elsevier Inc. ° °Dysuria °Dysuria is pain or discomfort while urinating. The pain or discomfort may be felt in the tube that carries urine out of the bladder (urethra) or in the surrounding tissue of the genitals. The pain may also be felt in the groin area, lower abdomen, and lower back. You may have to urinate frequently or have the sudden feeling that you have to urinate (urgency). Dysuria can affect both men and women, but is more common in women. °Dysuria can be caused by many different things, including: °· Urinary tract infection in women. °· Infection of the kidney or bladder. °· Kidney stones or bladder stones. °· Certain sexually transmitted infections (STIs), such as chlamydia. °· Dehydration. °· Inflammation of the vagina. °· Use of certain medicines. °· Use of certain soaps or scented products that cause irritation. ° °Follow these instructions at home: °Watch your dysuria for any changes. The following actions may help to reduce any discomfort you are feeling: °· Drink enough fluid to keep your urine clear or pale yellow. °· Empty your bladder often. Avoid holding urine for long periods of time. °· After a bowel movement or urination, women should cleanse from front to back, using each tissue only once. °· Empty your bladder after sexual intercourse. °· Take medicines only as directed by your health care provider. °· If you were prescribed an antibiotic medicine, finish it all even if you start to feel better. °· Avoid caffeine, tea, and alcohol. They can irritate the bladder and make dysuria worse. In  men, alcohol may irritate the prostate. °· Keep all follow-up visits as directed by your health care provider. This is important. °· If you had any tests done to find the cause of dysuria, it is your responsibility to obtain your test results. Ask the lab or department performing the test when and how you will get your results. Talk with your health care provider if you have any questions about your results. ° °Contact a health care provider if: °· You develop pain in your back or sides. °· You have a fever. °· You have nausea or vomiting. °· You have blood in your urine. °· You are not urinating as often as you usually do. °Get help right away if: °· You pain is severe and not relieved with medicines. °· You are unable to hold down any fluids. °· You or someone else notices a change in your mental function. °· You have a rapid heartbeat at rest. °· You have shaking or chills. °· You feel extremely weak. °This information is not intended to replace advice given to you by your health care provider. Make sure you discuss any questions you have with your health care provider. °Document Released: 04/09/2004 Document Revised: 12/18/2015 Document Reviewed: 03/07/2014 °Elsevier Interactive Patient Education © 2018 Elsevier Inc. ° °

## 2018-03-27 NOTE — MAU Provider Note (Signed)
Chief Complaint:  Abdominal Pain and Dysuria   None    HPI: Annette Sullivan is a 32 y.o. [phone] at [redacted]w[redacted]d who presents to maternity admissions reporting dysuria, urinary frequency for 1 day. Pt denies having intercourse for months, and denies needing sti testing, last PNV testing was negative early pregnancy. Pt denies any vaginal discharge, no foul smells, no hematuria, no n, v, d, rashes, fever, cp or sob. Pt denies ever having a UTI in the past.  Pt also enodorses having BHC. Denies leakage of fluid or vaginal bleeding. Good fetal movement. Pt endorses that she has had GHTN that started after 20 weeks of pregnancy in her other pregnancy, currently not checking BP at home, but in the office pt has had Bps of 130-120/70-80s. Pt denies HA, vision changes, no RUQ pain, and no severe swelling.   Pregnancy Course:   Past Medical History:  Diagnosis Date  . Anemia   . Anxiety   . Headache(784.0)   . Hypertension   . No pertinent past medical history   . Obesity (BMI 30-39.9)    OB History  Gravida Para Term Preterm AB Living  4 2 2  0 1 2  SAB TAB Ectopic Multiple Live Births  0 1 0 0 2    # Outcome Date GA Lbr Len/2nd Weight Sex Delivery Anes PTL Lv  4 Current           3 Term 08/24/12 [redacted]w[redacted]d 01:50 / 00:13 3487 g M Vag-Spont Local, EPI  LIV     Birth Comments: caput  2 Term 2011 [redacted]w[redacted]d  3345 g F Vag-Spont EPI  LIV  1 TAB 2010           Past Surgical History:  Procedure Laterality Date  . APPENDECTOMY    . INSERTION OF MESH N/A 12/18/2015   Procedure: INSERTION OF MESH;  Surgeon: Abigail Miyamoto, MD;  Location: [facility] OR;  Service: General;  Laterality: N/A;  . UMBILICAL HERNIA REPAIR N/A 12/18/2015   Procedure: HERNIA REPAIR UMBILICAL ADULT WITH MESH ;  Surgeon: Abigail Miyamoto, MD;  Location: [facility] OR;  Service: General;  Laterality: N/A;   Family History  Problem Relation Age of Onset  . Anesthesia problems Neg Hx    Social History   Tobacco Use  . Smoking status: Never Smoker  .  Smokeless tobacco: Never Used  Substance Use Topics  . Alcohol use: No  . Drug use: No   No Known Allergies No medications prior to admission.    I have reviewed patient's Past Medical Hx, Surgical Hx, Family Hx, Social Hx, medications and allergies.   ROS:  Review of Systems  All other systems reviewed and are negative.   Physical Exam   Patient Vitals for the past 24 hrs:  BP Temp Temp src Pulse Resp SpO2 Height Weight  03/27/18 0410 139/81 - - 92 - - - -  03/27/18 0401 137/75 - - 88 - - - -  03/27/18 0351 133/76 - - 80 - - - -  03/27/18 0340 132/79 - - 83 - - - -  03/27/18 0331 121/68 - - 100 - - - -  03/27/18 0321 139/79 - - 86 - - - -  03/27/18 0310 (!) 145/86 - - 95 - - - -  03/27/18 0301 (!) 142/92 - - 96 - - - -  03/27/18 0241 (!) 146/80 - - 90 - - - -  03/27/18 0231 (!) 144/79 - - 86 - - - -  03/27/18 0221 (!) 141/81 - - 85 - - - -  03/27/18 0211 (!) 143/85 - - 89 - - - -  03/27/18 0201 (!) 146/86 - - 90 - - - -  03/27/18 0151 (!) 146/83 - - 89 - - - -  03/27/18 0136 (!) 145/84 - - 91 - - - -  03/27/18 0115 (!) 152/72 98.3 F (36.8 C) Oral 89 18 100 % 5\' 4"  (1.626 m) 103.4 kg   Constitutional: Well-developed, obese, well-nourished female in no acute distress.  Cardiovascular: normal rate Respiratory: normal effort, CTA-bi-laterally. No wheezing or rhonchi noted.  GI: Abd soft, non-tender, gravid appropriate for gestational age. Pos BS x 4 MS: Extremities nontender, mild general edema, normal ROM Neurologic: Alert and oriented x 4. DTR +2, patellar bilaterally. No clonus.  GU: Neg CVAT.  NST: FHR baseline 120 bpm, Variability: moderate, Accelerations:present, Decelerations:  Absent= Cat 1/Reactive UC:  X5 in last hour. Mild to palpate, lasted 40-60 seconds.    Labs: Results for orders placed or performed during the hospital encounter of 03/27/18 (from the past 24 hour(s))  Urinalysis, Routine w reflex microscopic     Status: Abnormal   Collection Time:  03/27/18  1:26 AM  Result Value Ref Range   Color, Urine YELLOW YELLOW   APPearance HAZY (A) CLEAR   Specific Gravity, Urine 1.012 1.005 - 1.030   pH 6.0 5.0 - 8.0   Glucose, UA NEGATIVE NEGATIVE mg/dL   Hgb urine dipstick NEGATIVE NEGATIVE   Bilirubin Urine NEGATIVE NEGATIVE   Ketones, ur 5 (A) NEGATIVE mg/dL   Protein, ur NEGATIVE NEGATIVE mg/dL   Nitrite NEGATIVE NEGATIVE   Leukocytes, UA LARGE (A) NEGATIVE   RBC / HPF 0-5 0 - 5 RBC/hpf   WBC, UA 6-10 0 - 5 WBC/hpf   Bacteria, UA RARE (A) NONE SEEN   Squamous Epithelial / LPF 0-5 0 - 5   Mucus PRESENT   Protein / creatinine ratio, urine     Status: None   Collection Time: 03/27/18  1:26 AM  Result Value Ref Range   Creatinine, Urine 87.00 mg/dL   Total Protein, Urine 10 mg/dL   Protein Creatinine Ratio 0.11 0.00 - 0.15 mg/mg[Cre]  Comprehensive metabolic panel     Status: Abnormal   Collection Time: 03/27/18  3:30 AM  Result Value Ref Range   Sodium 132 (L) 135 - 145 mmol/L   Potassium 3.5 3.5 - 5.1 mmol/L   Chloride 101 98 - 111 mmol/L   CO2 21 (L) 22 - 32 mmol/L   Glucose, Bld 96 70 - 99 mg/dL   BUN 8 6 - 20 mg/dL   Creatinine, Ser 1.61 0.44 - 1.00 mg/dL   Calcium 8.9 8.9 - 09.6 mg/dL   Total Protein 5.9 (L) 6.5 - 8.1 g/dL   Albumin 2.9 (L) 3.5 - 5.0 g/dL   AST 16 15 - 41 U/L   ALT 14 0 - 44 U/L   Alkaline Phosphatase 132 (H) 38 - 126 U/L   Total Bilirubin 0.7 0.3 - 1.2 mg/dL   GFR calc non Af Amer >60 >60 mL/min   GFR calc Af Amer >60 >60 mL/min   Anion gap 10 5 - 15  CBC with Differential/Platelet     Status: Abnormal   Collection Time: 03/27/18  3:30 AM  Result Value Ref Range   WBC 7.5 4.0 - 10.5 K/uL   RBC 3.92 3.87 - 5.11 MIL/uL   Hemoglobin 10.5 (L) 12.0 - 15.0 g/dL  HCT 32.5 (L) 36.0 - 46.0 %   MCV 82.9 78.0 - 100.0 fL   MCH 26.8 26.0 - 34.0 pg   MCHC 32.3 30.0 - 36.0 g/dL   RDW 91.4 78.2 - 95.6 %   Platelets 214 150 - 400 K/uL   Neutrophils Relative % 63 %   Neutro Abs 4.7 1.7 - 7.7 K/uL    Lymphocytes Relative 32 %   Lymphs Abs 2.4 0.7 - 4.0 K/uL   Monocytes Relative 2 %   Monocytes Absolute 0.2 0.1 - 1.0 K/uL   Eosinophils Relative 3 %   Eosinophils Absolute 0.2 0.0 - 0.7 K/uL   Basophils Relative 0 %   Basophils Absolute 0.0 0.0 - 0.1 K/uL  Lactate dehydrogenase     Status: None   Collection Time: 03/27/18  3:30 AM  Result Value Ref Range   LDH 114 98 - 192 U/L  Uric acid     Status: None   Collection Time: 03/27/18  3:30 AM  Result Value Ref Range   Uric Acid, Serum 4.8 2.5 - 7.1 mg/dL    Imaging:  No results found.  MAU Course: Orders Placed This Encounter  Procedures  . Culture, Urine  . Urinalysis, Routine w reflex microscopic  . Comprehensive metabolic panel  . CBC with Differential/Platelet  . Protein / creatinine ratio, urine  . Lactate dehydrogenase  . Uric acid  . Diet - low sodium heart healthy  . Increase activity slowly  . Call MD for:  . Call MD for:  temperature >100.4  . Call MD for:  persistant nausea and vomiting  . Call MD for:  severe uncontrolled pain  . Call MD for:  redness, tenderness, or signs of infection (pain, swelling, redness, odor or green/yellow discharge around incision site)  . Call MD for:  difficulty breathing, headache or visual disturbances  . Call MD for:  hives  . Call MD for:  persistant dizziness or light-headedness  . Call MD for:  extreme fatigue  . (HEART FAILURE PATIENTS) Call MD:  Anytime you have any of the following symptoms: 1) 3 pound weight gain in 24 hours or 5 pounds in 1 week 2) shortness of breath, with or without a dry hacking cough 3) swelling in the hands, feet or stomach 4) if you have to sleep on extra pillows at night in order to breathe.  . Discharge patient Discharge disposition: 01-Home or Self Care; Discharge patient date: 03/27/2018   Meds ordered this encounter  Medications  . DISCONTD: nitrofurantoin, macrocrystal-monohydrate, (MACROBID) 100 MG capsule    Sig: Take 1 capsule (100 mg  total) by mouth 2 (two) times daily for 7 days.    Dispense:  14 capsule    Refill:  0  . nitrofurantoin, macrocrystal-monohydrate, (MACROBID) 100 MG capsule    Sig: Take 1 capsule (100 mg total) by mouth 2 (two) times daily for 7 days.    Dispense:  14 capsule    Refill:  0    MDM: Reviewed labs and chart. NST reactive, labs neg for Pre E.   Assessment:  Annette Sullivan is a 32 y.o. [phone] at [redacted]w[redacted]d based on dysuria and urinary frequency, with UA showing leuks, I am presumptively tx pt for a UTI, sent UC and waiting for result. Bp ranges here were 140s/80s, last discharge BP was 139/81. In office Bps ranges have been WNL. Pt stable, no medication require, pt has GHTN. PreEclampsia labs were unremarkable. PCR was 0.22.  1. Gestational hypertension  affecting third pregnancy   2. Dysuria   3. Braxton Hick's contraction     Plan: Dysuria: Will take macrobid 100 mg BID x 7 days, Pending UC, if UC results negative will stop abx and f/u with [facility] will need STI testing with GBS at next ROB on 09/09. BP: check BP at home with goal of less than 150/90, if increased come to MAU. Eat low sodium diet. Walk daily. If s/sx of HA/vision changes/RUQ pain or severe swelling occur, come to the MAU.  Discharge home in stable condition.  Labor precautions and fetal kick counts Follow-up Information    [facility] Follow up.   Specialty:  Obstetrics and Gynecology Why:  04/03/2018 for GBS/gc/c and ROB and BP check  Contact information: [address] [place] [address] [phone]          Allergies as of 03/27/2018   No Known Allergies     Medication List    STOP taking these medications   NIFEdipine 10 MG capsule Commonly known as:  PROCARDIA     TAKE these medications   acetaminophen 325 MG tablet Commonly known as:  TYLENOL Take 650 mg by mouth every 6 (six) hours as needed for mild pain or moderate pain.   hydrOXYzine 25 MG  tablet Commonly known as:  ATARAX/VISTARIL Take 1 tablet (25 mg total) by mouth every 6 (six) hours as needed for itching.   nitrofurantoin (macrocrystal-monohydrate) 100 MG capsule Commonly known as:  MACROBID Take 1 capsule (100 mg total) by mouth 2 (two) times daily for 7 days.   ondansetron 4 MG disintegrating tablet Commonly known as:  ZOFRAN-ODT 4mg  ODT q4 hours prn nausea/vomit   prenatal multivitamin Tabs tablet Take 1 tablet by mouth daily at 12 noon.   progesterone 200 MG capsule Commonly known as:  PROMETRIUM Place 1 capsule (200 mg total) vaginally at bedtime.       [facility] NP-C, CNM [place], [place], [place] 03/27/2018 7:40 AM

## 2018-03-27 NOTE — MAU Note (Signed)
Pt reports lower abdominal and back cramping that started yesterday. Pt also reports pain and increased frequency with urination. Pt denies bleeding. Reports good fetal movement.

## 2018-03-28 LAB — URINE CULTURE

## 2018-04-03 LAB — OB RESULTS CONSOLE GBS: GBS: NEGATIVE

## 2018-04-18 ENCOUNTER — Inpatient Hospital Stay (HOSPITAL_COMMUNITY)
Admission: AD | Admit: 2018-04-18 | Discharge: 2018-04-21 | DRG: 806 | Disposition: A | Discharge status: Home / Self Care | Payer: 59 | Attending: Obstetrics and Gynecology | Admitting: Obstetrics and Gynecology

## 2018-04-18 ENCOUNTER — Encounter (HOSPITAL_COMMUNITY): Payer: Self-pay

## 2018-04-18 DIAGNOSIS — Z23 Encounter for immunization: Secondary | ICD-10-CM | POA: Diagnosis not present

## 2018-04-18 DIAGNOSIS — Z3A38 38 weeks gestation of pregnancy: Secondary | ICD-10-CM

## 2018-04-18 DIAGNOSIS — O9081 Anemia of the puerperium: Secondary | ICD-10-CM

## 2018-04-18 DIAGNOSIS — O134 Gestational [pregnancy-induced] hypertension without significant proteinuria, complicating childbirth: Secondary | ICD-10-CM | POA: Diagnosis present

## 2018-04-18 DIAGNOSIS — O139 Gestational [pregnancy-induced] hypertension without significant proteinuria, unspecified trimester: Secondary | ICD-10-CM

## 2018-04-18 DIAGNOSIS — O133 Gestational [pregnancy-induced] hypertension without significant proteinuria, third trimester: Secondary | ICD-10-CM

## 2018-04-18 DIAGNOSIS — D62 Acute posthemorrhagic anemia: Secondary | ICD-10-CM | POA: Diagnosis not present

## 2018-04-18 HISTORY — DX: Gestational (pregnancy-induced) hypertension without significant proteinuria, unspecified trimester: O13.9

## 2018-04-18 LAB — CBC WITH DIFFERENTIAL/PLATELET
BASOS ABS: 0 10*3/uL (ref 0.0–0.1)
BASOS PCT: 0 %
Eosinophils Absolute: 0.2 10*3/uL (ref 0.0–0.7)
Eosinophils Relative: 2 %
HEMATOCRIT: 34.2 % — AB (ref 36.0–46.0)
HEMOGLOBIN: 11.1 g/dL — AB (ref 12.0–15.0)
LYMPHS PCT: 26 %
Lymphs Abs: 1.8 10*3/uL (ref 0.7–4.0)
MCH: 27.3 pg (ref 26.0–34.0)
MCHC: 32.5 g/dL (ref 30.0–36.0)
MCV: 84 fL (ref 78.0–100.0)
Monocytes Absolute: 0.2 10*3/uL (ref 0.1–1.0)
Monocytes Relative: 3 %
NEUTROS ABS: 4.8 10*3/uL (ref 1.7–7.7)
Neutrophils Relative %: 69 %
Platelets: 252 10*3/uL (ref 150–400)
RBC: 4.07 MIL/uL (ref 3.87–5.11)
RDW: 14.2 % (ref 11.5–15.5)
WBC: 7 10*3/uL (ref 4.0–10.5)

## 2018-04-18 LAB — COMPREHENSIVE METABOLIC PANEL
ALBUMIN: 3.3 g/dL — AB (ref 3.5–5.0)
ALT: 15 U/L (ref 0–44)
AST: 33 U/L (ref 15–41)
Alkaline Phosphatase: 173 U/L — ABNORMAL HIGH (ref 38–126)
Anion gap: 10 (ref 5–15)
BILIRUBIN TOTAL: 1 mg/dL (ref 0.3–1.2)
BUN: 7 mg/dL (ref 6–20)
CALCIUM: 9.3 mg/dL (ref 8.9–10.3)
CO2: 20 mmol/L — ABNORMAL LOW (ref 22–32)
CREATININE: 0.45 mg/dL (ref 0.44–1.00)
Chloride: 105 mmol/L (ref 98–111)
GFR calc Af Amer: >60 mL/min (ref >60)
GFR calc non Af Amer: >60 mL/min (ref >60)
GLUCOSE: 79 mg/dL (ref 70–99)
Potassium: 4.5 mmol/L (ref 3.5–5.1)
Sodium: 135 mmol/L (ref 135–145)
TOTAL PROTEIN: 6.4 g/dL — AB (ref 6.5–8.1)

## 2018-04-18 LAB — URINALYSIS, ROUTINE W REFLEX MICROSCOPIC
BILIRUBIN URINE: NEGATIVE
GLUCOSE, UA: NEGATIVE mg/dL
Hgb urine dipstick: NEGATIVE
KETONES UR: 5 mg/dL — AB
Nitrite: NEGATIVE
PH: 6 (ref 5.0–8.0)
Protein, ur: NEGATIVE mg/dL
Specific Gravity, Urine: 1.013 (ref 1.005–1.030)

## 2018-04-18 LAB — PROTEIN / CREATININE RATIO, URINE
Creatinine, Urine: 88 mg/dL
PROTEIN CREATININE RATIO: 0.08 mg/mg{creat} (ref 0.00–0.15)
TOTAL PROTEIN, URINE: 7 mg/dL

## 2018-04-18 LAB — TYPE AND SCREEN
ABO/RH(D): A POS
ANTIBODY SCREEN: NEGATIVE

## 2018-04-18 LAB — URIC ACID: Uric Acid, Serum: 5.2 mg/dL (ref 2.5–7.1)

## 2018-04-18 MED ORDER — LACTATED RINGERS IV SOLN: 500.0000 mL | INTRAVENOUS | Status: DC | PRN

## 2018-04-18 MED ORDER — OXYTOCIN 40 UNITS IN LACTATED RINGERS INFUSION - SIMPLE MED
1.0000 m[IU]/min | INTRAVENOUS | Status: DC
  Administered 2018-04-18: 1 m[IU]/min via INTRAVENOUS
  Filled 2018-04-18: qty 1000

## 2018-04-18 MED ORDER — OXYTOCIN 40 UNITS IN LACTATED RINGERS INFUSION - SIMPLE MED: 2.5000 [IU]/h | INTRAVENOUS | Status: DC

## 2018-04-18 MED ORDER — ONDANSETRON HCL 4 MG/2ML IJ SOLN: 4.0000 mg | Freq: Four times a day (QID) | INTRAMUSCULAR | Status: DC | PRN

## 2018-04-18 MED ORDER — OXYCODONE-ACETAMINOPHEN 5-325 MG PO TABS: 1.0000 | ORAL_TABLET | ORAL | Status: DC | PRN

## 2018-04-18 MED ORDER — OXYTOCIN BOLUS FROM INFUSION
500.0000 mL | Freq: Once | INTRAVENOUS | Status: AC
  Administered 2018-04-19: 500 mL via INTRAVENOUS

## 2018-04-18 MED ORDER — LACTATED RINGERS IV SOLN: INTRAVENOUS | Status: DC

## 2018-04-18 MED ORDER — ACETAMINOPHEN 325 MG PO TABS
650.0000 mg | ORAL_TABLET | ORAL | Status: DC | PRN
  Administered 2018-04-19: 650 mg via ORAL
  Filled 2018-04-18 (×2): qty 2

## 2018-04-18 MED ORDER — SOD CITRATE-CITRIC ACID 500-334 MG/5ML PO SOLN: 30.0000 mL | ORAL | Status: DC | PRN

## 2018-04-18 MED ORDER — LIDOCAINE HCL (PF) 1 % IJ SOLN
30.0000 mL | INTRAMUSCULAR | Status: DC | PRN
  Filled 2018-04-18: qty 30

## 2018-04-18 MED ORDER — OXYCODONE-ACETAMINOPHEN 5-325 MG PO TABS: 2.0000 | ORAL_TABLET | ORAL | Status: DC | PRN

## 2018-04-18 NOTE — MAU Note (Signed)
Pt having ctx since 3pm. Getting closer together. 3cm in office last week.

## 2018-04-18 NOTE — H&P (Signed)
Annette RobinsonMary A Sullivan is a 32 y.o. female, [id number] at 38.3 weeks, presenting for contractions.  Denies leakage of fluid.  FM+  Initial BP high.  Denies headache or blurred vision.  Prenatal hx remarkable for increased BMI  Patient Active Problem List   Diagnosis Date Noted  . Gestational hypertension 04/18/2018    History of present pregnancy: Patient entered care at 13 weeks.   EDC of 38.3 was established by LMP.   Anatomy scan:  20 weeks, with normal findings and an anterior placenta.   Additional US evaluations:  None  Significant prenatal events:   Last evaluation:  today  OB History    Gravida  4   Para  2   Term  2   Preterm  0   AB  1   Living  2     SAB  0   TAB  1   Ectopic  0   Multiple  0   Live Births  2          Past Medical History:  Diagnosis Date  . Anemia   . Anxiety   . Headache(784.0)   . Hypertension   . No pertinent past medical history   . Obesity (BMI 30-39.9)    Past Surgical History:  Procedure Laterality Date  . APPENDECTOMY    . INSERTION OF MESH N/A 12/18/2015   Procedure: INSERTION OF MESH;  Surgeon: Abigail Miyamotoouglas Blackman, MD;  [facility];  Service: General;  Laterality: N/A;  . UMBILICAL HERNIA REPAIR N/A 12/18/2015   Procedure: HERNIA REPAIR UMBILICAL ADULT WITH MESH ;  Surgeon: Abigail Miyamotoouglas Blackman, MD;  [facility];  Service: General;  Laterality: N/A;   Family History: family history is not on file. Social History:  reports that she has never smoked. She has never used smokeless tobacco. She reports that she does not drink alcohol or use drugs.   Prenatal Transfer Tool  Maternal Diabetes: No Genetic Screening: Declined Maternal Ultrasounds/Referrals: Normal Fetal Ultrasounds or other Referrals:  None Maternal Substance Abuse:  No Significant Maternal Medications:  None Significant Maternal Lab Results: None  TDAP none Flu none  ROS:  All 10 systems reviewed and negative except as stated above  No Known  Allergies   Dilation: 4 Effacement (%): 70 Station: -2 Exam by:: N Prothero CNM Blood pressure 140/81, pulse 95, temperature 98.4 F (36.9 C), temperature source Oral, resp. rate 18, height 5\' 4"  (1.626 m), weight 104.7 kg, unknown if currently breastfeeding.  Chest clear Heart RRR without murmur Abd gravid, NT, FH appropriate Pelvic: adequate Ext: Neg  FHR: Category 1 UCs:  Every 4  Prenatal labs: ABO, Rh: A/Positive/-- (04/03 0000) Antibody: neg (04/03 0000) Rubella:  Immune (04/03 0000) RPR: Nonreactive (04/03 0000)  HBsAg: Negative (04/03 0000)  HIV: Non-reactive (04/03 0000)  GBS: Negative (09/09 0000) Sickle cell/Hgb electrophoresis:  AA Pap:   GC:  Neg Chlamydia:  neg Genetic screenings:  Declined Glucola:  HGBA1c 4.4 Other:   Hgb 11.3 at NOB, 10.6 at 28 weeks Results for orders placed or performed during the hospital encounter of 04/18/18 (from the past 24 hour(s))  Urinalysis, Routine w reflex microscopic     Status: Abnormal   Collection Time: 04/18/18  7:37 PM  Result Value Ref Range   Color, Urine YELLOW YELLOW   APPearance CLEAR CLEAR   Specific Gravity, Urine 1.013 1.005 - 1.030   pH 6.0 5.0 - 8.0   Glucose, UA NEGATIVE NEGATIVE mg/dL   Hgb urine dipstick  NEGATIVE NEGATIVE   Bilirubin Urine NEGATIVE NEGATIVE   Ketones, ur 5 (A) NEGATIVE mg/dL   Protein, ur NEGATIVE NEGATIVE mg/dL   Nitrite NEGATIVE NEGATIVE   Leukocytes, UA LARGE (A) NEGATIVE   RBC / HPF 0-5 0 - 5 RBC/hpf   WBC, UA 6-10 0 - 5 WBC/hpf   Bacteria, UA RARE (A) NONE SEEN   Squamous Epithelial / LPF 0-5 0 - 5   Mucus PRESENT   Protein / creatinine ratio, urine     Status: None   Collection Time: 04/18/18  7:37 PM  Result Value Ref Range   Creatinine, Urine 88.00 mg/dL   Total Protein, Urine 7 mg/dL   Protein Creatinine Ratio 0.08 0.00 - 0.15 mg/mg[Cre]  CBC with Differential/Platelet     Status: Abnormal   Collection Time: 04/18/18  7:55 PM  Result Value Ref Range   WBC 7.0 4.0  - 10.5 K/uL   RBC 4.07 3.87 - 5.11 MIL/uL   Hemoglobin 11.1 (L) 12.0 - 15.0 g/dL   HCT 40.9 (L) 81.1 - 91.4 %   MCV 84.0 78.0 - 100.0 fL   MCH 27.3 26.0 - 34.0 pg   MCHC 32.5 30.0 - 36.0 g/dL   RDW 78.2 95.6 - 21.3 %   Platelets 252 150 - 400 K/uL   Neutrophils Relative % 69 %   Neutro Abs 4.8 1.7 - 7.7 K/uL   Lymphocytes Relative 26 %   Lymphs Abs 1.8 0.7 - 4.0 K/uL   Monocytes Relative 3 %   Monocytes Absolute 0.2 0.1 - 1.0 K/uL   Eosinophils Relative 2 %   Eosinophils Absolute 0.2 0.0 - 0.7 K/uL   Basophils Relative 0 %   Basophils Absolute 0.0 0.0 - 0.1 K/uL  Comprehensive metabolic panel     Status: Abnormal   Collection Time: 04/18/18  7:55 PM  Result Value Ref Range   Sodium 135 135 - 145 mmol/L   Potassium 4.5 3.5 - 5.1 mmol/L   Chloride 105 98 - 111 mmol/L   CO2 20 (L) 22 - 32 mmol/L   Glucose, Bld 79 70 - 99 mg/dL   BUN 7 6 - 20 mg/dL   Creatinine, Ser 0.86 0.44 - 1.00 mg/dL   Calcium 9.3 8.9 - 57.8 mg/dL   Total Protein 6.4 (L) 6.5 - 8.1 g/dL   Albumin 3.3 (L) 3.5 - 5.0 g/dL   AST 33 15 - 41 U/L   ALT 15 0 - 44 U/L   Alkaline Phosphatase 173 (H) 38 - 126 U/L   Total Bilirubin 1.0 0.3 - 1.2 mg/dL   GFR calc non Af Amer >60 >60 mL/min   GFR calc Af Amer >60 >60 mL/min   Anion gap 10 5 - 15  Uric acid     Status: None   Collection Time: 04/18/18  7:55 PM  Result Value Ref Range   Uric Acid, Serum 5.2 2.5 - 7.1 mg/dL  Type and screen     Status: None   Collection Time: 04/18/18  8:51 PM  Result Value Ref Range   ABO/RH(D) A POS    Antibody Screen NEG    Sample Expiration      04/21/2018 Performed at [facility], [address], [place], [place] [address]         Assessment/Plan: IUP at 38.3 IUP with gestational hypertension in early latent labor Cat 1 strip GBS negative  Plan: Admit to Birthing Suite  Routine [facility] orders Pre eclampic workup Pain med/epidural prn  Henderson Newcomer ProtheroCNM, MSN 04/18/2018, 8:24 PM

## 2018-04-19 ENCOUNTER — Encounter (HOSPITAL_COMMUNITY): Payer: Self-pay | Admitting: *Deleted

## 2018-04-19 ENCOUNTER — Inpatient Hospital Stay (HOSPITAL_COMMUNITY): Payer: 59 | Admitting: Anesthesiology

## 2018-04-19 LAB — PROTEIN / CREATININE RATIO, URINE
Creatinine, Urine: 35 mg/dL
Protein Creatinine Ratio: 0.54 mg/mg{Cre} — ABNORMAL HIGH (ref 0.00–0.15)
TOTAL PROTEIN, URINE: 19 mg/dL

## 2018-04-19 LAB — RPR: RPR Ser Ql: NONREACTIVE

## 2018-04-19 MED ORDER — COCONUT OIL OIL
1.0000 "application " | TOPICAL_OIL | Status: DC | PRN
  Administered 2018-04-20: 1 via TOPICAL
  Filled 2018-04-19: qty 120

## 2018-04-19 MED ORDER — OXYCODONE HCL 5 MG PO TABS: 10.0000 mg | ORAL_TABLET | ORAL | Status: DC | PRN

## 2018-04-19 MED ORDER — BENZOCAINE-MENTHOL 20-0.5 % EX AERO: 1.0000 "application " | INHALATION_SPRAY | CUTANEOUS | Status: DC | PRN

## 2018-04-19 MED ORDER — MEDROXYPROGESTERONE ACETATE 150 MG/ML IM SUSP: 150.0000 mg | INTRAMUSCULAR | Status: DC | PRN

## 2018-04-19 MED ORDER — PHENYLEPHRINE 40 MCG/ML (10ML) SYRINGE FOR IV PUSH (FOR BLOOD PRESSURE SUPPORT)
80.0000 ug | PREFILLED_SYRINGE | INTRAVENOUS | Status: DC | PRN
  Filled 2018-04-19: qty 10

## 2018-04-19 MED ORDER — IBUPROFEN 600 MG PO TABS
600.0000 mg | ORAL_TABLET | Freq: Four times a day (QID) | ORAL | Status: DC
  Administered 2018-04-19 – 2018-04-21 (×7): 600 mg via ORAL
  Filled 2018-04-19 (×8): qty 1

## 2018-04-19 MED ORDER — WITCH HAZEL-GLYCERIN EX PADS: 1.0000 "application " | MEDICATED_PAD | CUTANEOUS | Status: DC | PRN

## 2018-04-19 MED ORDER — ACETAMINOPHEN 325 MG PO TABS
650.0000 mg | ORAL_TABLET | ORAL | Status: DC | PRN
  Administered 2018-04-20: 650 mg via ORAL

## 2018-04-19 MED ORDER — PHENYLEPHRINE 40 MCG/ML (10ML) SYRINGE FOR IV PUSH (FOR BLOOD PRESSURE SUPPORT): 80.0000 ug | PREFILLED_SYRINGE | INTRAVENOUS | Status: DC | PRN

## 2018-04-19 MED ORDER — DIPHENHYDRAMINE HCL 50 MG/ML IJ SOLN: 12.5000 mg | INTRAMUSCULAR | Status: DC | PRN

## 2018-04-19 MED ORDER — OXYCODONE HCL 5 MG PO TABS
5.0000 mg | ORAL_TABLET | ORAL | Status: DC | PRN
  Administered 2018-04-20: 5 mg via ORAL
  Filled 2018-04-19: qty 1

## 2018-04-19 MED ORDER — ONDANSETRON HCL 4 MG/2ML IJ SOLN: 4.0000 mg | INTRAMUSCULAR | Status: DC | PRN

## 2018-04-19 MED ORDER — EPHEDRINE 5 MG/ML INJ: 10.0000 mg | INTRAVENOUS | Status: DC | PRN

## 2018-04-19 MED ORDER — TETANUS-DIPHTH-ACELL PERTUSSIS 5-2.5-18.5 LF-MCG/0.5 IM SUSP: 0.5000 mL | Freq: Once | INTRAMUSCULAR | Status: DC

## 2018-04-19 MED ORDER — DIBUCAINE 1 % RE OINT: 1.0000 "application " | TOPICAL_OINTMENT | RECTAL | Status: DC | PRN

## 2018-04-19 MED ORDER — FENTANYL 2.5 MCG/ML BUPIVACAINE 1/10 % EPIDURAL INFUSION (WH - ANES)
14.0000 mL/h | INTRAMUSCULAR | Status: DC | PRN
  Administered 2018-04-19: 14 mL/h via EPIDURAL
  Filled 2018-04-19: qty 100

## 2018-04-19 MED ORDER — SENNOSIDES-DOCUSATE SODIUM 8.6-50 MG PO TABS
2.0000 | ORAL_TABLET | ORAL | Status: DC
  Administered 2018-04-19 – 2018-04-20 (×2): 2 via ORAL
  Filled 2018-04-19 (×2): qty 2

## 2018-04-19 MED ORDER — ZOLPIDEM TARTRATE 5 MG PO TABS: 5.0000 mg | ORAL_TABLET | Freq: Every evening | ORAL | Status: DC | PRN

## 2018-04-19 MED ORDER — IBUPROFEN 600 MG PO TABS: 600.0000 mg | ORAL_TABLET | Freq: Four times a day (QID) | ORAL | Status: DC

## 2018-04-19 MED ORDER — INFLUENZA VAC SPLIT QUAD 0.5 ML IM SUSY
0.5000 mL | PREFILLED_SYRINGE | INTRAMUSCULAR | Status: AC
  Administered 2018-04-20: 0.5 mL via INTRAMUSCULAR
  Filled 2018-04-19: qty 0.5

## 2018-04-19 MED ORDER — DIPHENHYDRAMINE HCL 25 MG PO CAPS: 25.0000 mg | ORAL_CAPSULE | Freq: Four times a day (QID) | ORAL | Status: DC | PRN

## 2018-04-19 MED ORDER — SIMETHICONE 80 MG PO CHEW: 80.0000 mg | CHEWABLE_TABLET | ORAL | Status: DC | PRN

## 2018-04-19 MED ORDER — ONDANSETRON HCL 4 MG PO TABS: 4.0000 mg | ORAL_TABLET | ORAL | Status: DC | PRN

## 2018-04-19 MED ORDER — LIDOCAINE-EPINEPHRINE (PF) 2 %-1:200000 IJ SOLN
INTRAMUSCULAR | Status: DC | PRN
  Administered 2018-04-19: 2 mL via EPIDURAL
  Administered 2018-04-19 (×2): 4 mL via EPIDURAL

## 2018-04-19 MED ORDER — LACTATED RINGERS IV SOLN
500.0000 mL | Freq: Once | INTRAVENOUS | Status: AC
  Administered 2018-04-19: 500 mL via INTRAVENOUS

## 2018-04-19 MED ORDER — PRENATAL MULTIVITAMIN CH
1.0000 | ORAL_TABLET | Freq: Every day | ORAL | Status: DC
  Administered 2018-04-20: 1 via ORAL
  Filled 2018-04-19 (×2): qty 1

## 2018-04-19 NOTE — Anesthesia Procedure Notes (Signed)
Epidural Patient location during procedure: OB Start time: 04/19/2018 8:10 AM End time: 04/19/2018 8:25 AM  Staffing Anesthesiologist: Elmer PickerWoodrum, Aariah Godette L, MD Performed: anesthesiologist   Preanesthetic Checklist Completed: patient identified, pre-op evaluation, timeout performed, IV checked, risks and benefits discussed and monitors and equipment checked  Epidural Patient position: sitting Prep: site prepped and draped and DuraPrep Patient monitoring: continuous pulse ox, blood pressure, heart rate and cardiac monitor Approach: midline Location: L3-L4 Injection technique: LOR air  Needle:  Needle type: Tuohy  Needle gauge: 17 G Needle length: 9 cm Needle insertion depth: 7 cm Catheter type: closed end flexible Catheter size: 19 Gauge Catheter at skin depth: 12 cm Test dose: negative  Assessment Sensory level: T8 Events: blood not aspirated, injection not painful, no injection resistance, negative IV test and no paresthesia  Additional Notes Patient identified. Risks/Benefits/Options discussed with patient including but not limited to bleeding, infection, nerve damage, paralysis, failed block, incomplete pain control, headache, blood pressure changes, nausea, vomiting, reactions to medication both or allergic, itching and postpartum back pain. Confirmed with bedside nurse the patient's most recent platelet count. Confirmed with patient that they are not currently taking any anticoagulation, have any bleeding history or any family history of bleeding disorders. Patient expressed understanding and wished to proceed. All questions were answered. Sterile technique was used throughout the entire procedure. Please see nursing notes for vital signs. Test dose was given through epidural catheter and negative prior to continuing to dose epidural or start infusion. Warning signs of high block given to the patient including shortness of breath, tingling/numbness in hands, complete motor block,  or any concerning symptoms with instructions to call for help. Patient was given instructions on fall risk and not to get out of bed. All questions and concerns addressed with instructions to call with any issues or inadequate analgesia.  Reason for block:procedure for pain

## 2018-04-19 NOTE — Progress Notes (Signed)
Annette RobinsonMary A Finelli is a 32 y.o. [id number] at 680w4d   Subjective: S/p epidural just now and already feeling better.    Objective: BP 135/82   Pulse (!) 112   Temp (!) 97.5 F (36.4 C) (Oral)   Resp (!) (P) 22   Ht 5\' 4"  (1.626 m)   Wt 104.7 kg   SpO2 99%   BMI 39.62 kg/m  No intake/output data recorded. No intake/output data recorded.  FHT:  FHR: 130s bpm, variability: moderate,  accelerations:  Present,  decelerations:  Absent UC:   regular, every 3-4 minutes SVE:   Dilation: 4.5 Effacement (%): 100 Station: -2 Exam by:: dr Su Hiltroberts  Labs: Lab Results  Component Value Date   WBC 7.0 04/18/2018   HGB 11.1 (L) 04/18/2018   HCT 34.2 (L) 04/18/2018   MCV 84.0 04/18/2018   PLT 252 04/18/2018    Assessment / Plan: Augmentation of labor, progressing well.  Pitocin was held by RN prior to epidural d/t tachysystole and pt not tolerating the pain well.  Will restart at 2mu now that pt is s/p epidural.  Labor: Cont to titrate pitocin per protocol Preeclampsia:  no signs or symptoms of toxicity.  BPs mildly elevated, no IV meds required.  Labs wnl. Fetal Wellbeing:  Category I Pain Control:  Epidural I/D:  GBS neg Anticipated MOD:  NSVD  Purcell Nailsngela Y Meleny Tregoning 04/19/2018, 9:01 AM

## 2018-04-19 NOTE — Anesthesia Postprocedure Evaluation (Signed)
Anesthesia Post Note  Patient: Annette Sullivan  Procedure(s) Performed: AN AD HOC LABOR EPIDURAL     Patient location during evaluation: Mother Baby Anesthesia Type: Epidural Level of consciousness: awake and alert Pain management: pain level controlled Vital Signs Assessment: post-procedure vital signs reviewed and stable Respiratory status: spontaneous breathing, nonlabored ventilation and respiratory function stable Cardiovascular status: stable Postop Assessment: no headache, no backache, epidural receding, able to ambulate, adequate PO intake, no apparent nausea or vomiting and patient able to bend at knees Anesthetic complications: no    Last Vitals:  Vitals:   04/19/18 1536 04/19/18 1639  BP: 138/79 133/86  Pulse: (!) 103 (!) 101  Resp: 20 17  Temp: 36.4 C 36.7 C  SpO2: 100%     Last Pain:  Vitals:   04/19/18 1639  TempSrc: Oral  PainSc:    Pain Goal:                 [place]

## 2018-04-19 NOTE — Progress Notes (Signed)
Annette Sullivan is a 32 y.o. [id number] at [redacted]w[redacted]d  Subjective: Comfortable with epidural.  RN called with update after I called at about 1035 to check on pt.  Objective: BP 115/64   Pulse (!) 102   Temp 98.8 F (37.1 C) (Oral)   Resp 18   Ht 5\' 4"  (1.626 m)   Wt 104.7 kg   SpO2 98%   BMI 39.62 kg/m  No intake/output data recorded. No intake/output data recorded.  FHT:  FHR: 125 bpm, variability: moderate,  accelerations:  Present,  decelerations:  Absent UC:   regular, every 2-3 minutes SVE:   Dilation: 5.5 Effacement (%): 100 Station: -1 Exam by:: jaton burgess rnc  Labs: Lab Results  Component Value Date   WBC 7.0 04/18/2018   HGB 11.1 (L) 04/18/2018   HCT 34.2 (L) 04/18/2018   MCV 84.0 04/18/2018   PLT 252 04/18/2018    Assessment / Plan: Augmentation of labor, progressing well  Labor: Progressing well on pitocin.  Cont to titrate as indicated. Preeclampsia:  no signs or symptoms of toxicity. BPs improved after epidural. Fetal Wellbeing:  Category I Pain Control:  Epidural I/D:  GBS neg Anticipated MOD:  NSVD  Purcell Nails 04/19/2018, 11:45 AM

## 2018-04-19 NOTE — Progress Notes (Signed)
Pt awoken for SVE.  C/C/+1.  Does not feel any pressure/pain.  Repositioned pt to left latera with peanut in place.  Pt instructed to let this RN know when feels pressure in bottom.

## 2018-04-19 NOTE — Progress Notes (Signed)
Subjective: Breathing with contractions.  Objective: BP 140/78   Pulse 96   Temp 98.2 F (36.8 C) (Oral)   Resp 18   Ht 5\' 4"  (1.626 m)   Wt 104.7 kg   BMI 39.62 kg/m  No intake/output data recorded. No intake/output data recorded.  FHT: Category 1 FHT 130 accels, no decels UC:   regular, every 3-4 minutes SVE:   Dilation: 4 Effacement (%): 60 Station: -2 Exam by:: N Prothero CNM Pitocin at 11 mu AROM clear fluid  Assessment:  IUP at 38.3 IUP with gestational hypertension in early latent labor Cat 1 strip GBS negative  Plan: Anticipate SVD  Kenney Houseman CNM, MSN 04/19/2018, 7:26 AM

## 2018-04-19 NOTE — Anesthesia Preprocedure Evaluation (Signed)
Anesthesia Evaluation  Patient identified by MRN, date of birth, ID band Patient awake    Reviewed: Allergy & Precautions, NPO status , Patient's Chart, lab work & pertinent test results  Airway Mallampati: II  TM Distance: >3 FB Neck ROM: Full    Dental no notable dental hx. (+) Teeth Intact   Pulmonary    Pulmonary exam normal breath sounds clear to auscultation       Cardiovascular hypertension (gestational HTN), Normal cardiovascular exam Rhythm:Regular Rate:Normal     Neuro/Psych  Headaches, Anxiety    GI/Hepatic negative GI ROS, Neg liver ROS,   Endo/Other  negative endocrine ROS  Renal/GU negative Renal ROS  negative genitourinary   Musculoskeletal negative musculoskeletal ROS (+)   Abdominal   Peds  Hematology  (+) anemia ,   Anesthesia Other Findings   Reproductive/Obstetrics (+) Pregnancy                             Anesthesia Physical Anesthesia Plan  ASA: III  Anesthesia Plan: Epidural   Post-op Pain Management:    Induction:   PONV Risk Score and Plan: Treatment may vary due to age or medical condition  Airway Management Planned: Natural Airway  Additional Equipment:   Intra-op Plan:   Post-operative Plan:   Informed Consent: I have reviewed the patients History and Physical, chart, labs and discussed the procedure including the risks, benefits and alternatives for the proposed anesthesia with the patient or authorized representative who has indicated his/her understanding and acceptance.     Plan Discussed with: Anesthesiologist  Anesthesia Plan Comments: (Patient identified. Risks, benefits, options discussed with patient including but not limited to bleeding, infection, nerve damage, paralysis, failed block, incomplete pain control, headache, blood pressure changes, nausea, vomiting, reactions to medication, itching, and post partum back pain. Confirmed with  bedside nurse the patient's most recent platelet count. Confirmed with the patient that they are not taking any anticoagulation, have any bleeding history or any family history of bleeding disorders. Patient expressed understanding and wishes to proceed. All questions were answered. )        Anesthesia Quick Evaluation

## 2018-04-20 ENCOUNTER — Other Ambulatory Visit: Payer: Self-pay

## 2018-04-20 DIAGNOSIS — O9081 Anemia of the puerperium: Secondary | ICD-10-CM

## 2018-04-20 HISTORY — DX: Anemia of the puerperium: O90.81

## 2018-04-20 LAB — CBC
HCT: 30.2 % — ABNORMAL LOW (ref 36.0–46.0)
Hemoglobin: 9.7 g/dL — ABNORMAL LOW (ref 12.0–15.0)
MCH: 26.7 pg (ref 26.0–34.0)
MCHC: 32.1 g/dL (ref 30.0–36.0)
MCV: 83.2 fL (ref 78.0–100.0)
Platelets: 201 10*3/uL (ref 150–400)
RBC: 3.63 MIL/uL — ABNORMAL LOW (ref 3.87–5.11)
RDW: 14.3 % (ref 11.5–15.5)
WBC: 10 10*3/uL (ref 4.0–10.5)

## 2018-04-20 LAB — COMPREHENSIVE METABOLIC PANEL
ALBUMIN: 2.6 g/dL — AB (ref 3.5–5.0)
ALT: 14 U/L (ref 0–44)
ANION GAP: 9 (ref 5–15)
AST: 20 U/L (ref 15–41)
Alkaline Phosphatase: 129 U/L — ABNORMAL HIGH (ref 38–126)
BUN: 8 mg/dL (ref 6–20)
CALCIUM: 8.9 mg/dL (ref 8.9–10.3)
CHLORIDE: 105 mmol/L (ref 98–111)
CO2: 24 mmol/L (ref 22–32)
Creatinine, Ser: 0.53 mg/dL (ref 0.44–1.00)
GFR calc non Af Amer: >60 mL/min (ref >60)
Glucose, Bld: 95 mg/dL (ref 70–99)
Potassium: 3.8 mmol/L (ref 3.5–5.1)
SODIUM: 138 mmol/L (ref 135–145)
TOTAL PROTEIN: 5.9 g/dL — AB (ref 6.5–8.1)
Total Bilirubin: 0.5 mg/dL (ref 0.3–1.2)

## 2018-04-20 LAB — URIC ACID: URIC ACID, SERUM: 5.5 mg/dL (ref 2.5–7.1)

## 2018-04-20 MED ORDER — FERROUS SULFATE 325 (65 FE) MG PO TABS
325.0000 mg | ORAL_TABLET | Freq: Every day | ORAL | Status: DC
  Administered 2018-04-20 – 2018-04-21 (×2): 325 mg via ORAL
  Filled 2018-04-20 (×2): qty 1

## 2018-04-20 NOTE — Progress Notes (Signed)
MOB was referred for history of depression/anxiety. * Referral screened out by Clinical Social Worker because none of the following criteria appear to apply: ~ History of anxiety/depression during this pregnancy, or of post-partum depression following prior delivery. ~ Diagnosis of anxiety and/or depression within last 3 years OR * MOB's symptoms currently being treated with medication and/or therapy. Please contact the Clinical Social Worker if needs arise, by MOB request, or if MOB scores greater than 9/yes to question 10 on Edinburgh Postpartum Depression Screen.  Annette Sullivan, MSW, LCSW Clinical Social Work [phone]  

## 2018-04-20 NOTE — Progress Notes (Signed)
Subjective: Postpartum Day 1: Vaginal delivery, no laceration Patient up ad lib, reports no syncope or dizziness. Feeding:  Breast Contraceptive plan:  Undecided  Denies HA, visual sx, or epigastric pain.  Objective: Vital signs in last 24 hours: Temp:  [97.6 F (36.4 C)-98.8 F (37.1 C)] 98.2 F (36.8 C) (09/26 0400) Pulse Rate:  [73-124] 73 (09/26 0400) Resp:  [15-22] 16 (09/26 0400) BP: (104-163)/(38-96) 137/85 (09/26 0400) SpO2:  [96 %-100 %] 100 % (09/25 1536)  Vitals:   04/19/18 1639 04/19/18 2026 04/20/18 0045 04/20/18 0400  BP: 133/86 127/84 131/78 137/85  Pulse: (!) 101 88 86 73  Resp: 17 16 16 16   Temp: 98 F (36.7 C) 98 F (36.7 C) 98.6 F (37 C) 98.2 F (36.8 C)  TempSrc: Oral Oral Oral Axillary  SpO2:      Weight:      Height:        Physical Exam:  General: alert Lochia: appropriate Uterine Fundus: firm Perineum: Intact DVT Evaluation: No evidence of DVT seen on physical exam.   CBC Latest Ref Rng & Units 04/20/2018 04/18/2018 03/27/2018  WBC 4.0 - 10.5 K/uL 10.0 7.0 7.5  Hemoglobin 12.0 - 15.0 g/dL 8.4(X) 11.1(L) 10.5(L)  Hematocrit 36.0 - 46.0 % 30.2(L) 34.2(L) 32.5(L)  Platelets 150 - 400 K/uL 201 252 214   PCR at 2020 0.54 (prior PCR 0.08 on 9/24)  Assessment/Plan: Status post vaginal delivery day 1. Gestational hypertension, with superimposed pre-eclampsia without severe features Anemia due to acute blood loss Consulted with Dr. Jerel Shepherd will consult with MFM regarding plan of care, ? Magnesium. Rx Fe q day. Reviewed possibility of need for magnesium pp--patient agreeable if that is needed. Undecided regarding contraception.  Options reviewed.    Nigel Bridgeman CNM 04/20/2018, 7:55 AM

## 2018-04-20 NOTE — Discharge Summary (Addendum)
SVD OB Discharge Summary     Patient Name: Annette Sullivan DOB: 1985-12-08 MRN: [id number]  Date of admission: 04/18/2018 Delivering MD: Osborn Coho  Date of delivery: 04/19/2018 Type of delivery: SVD  Newborn Data: Sex: Baby Female Circumcision: No Live born female  Birth Weight: 7 lb 4.4 oz (3300 g) APGAR: 7, 9  Newborn Delivery   Birth date/time:  04/19/2018 13:48:00 Delivery type:  Vaginal, Spontaneous     Feeding: breast Infant being discharge to home with mother in stable condition.   Admitting diagnosis: CTX Intrauterine pregnancy: [redacted]w[redacted]d     Secondary diagnosis:  Principal Problem:   Vaginal delivery Active Problems:   Gestational hypertension   Normal postpartum course   Postpartum anemia                                Complications: None                                                              Intrapartum Procedures: spontaneous vaginal delivery Postpartum Procedures: none Complications-Operative and Postpartum: none, Anemia Augmentation: AROM and Pitocin   History of Present Illness: Ms. Annette Sullivan is a 32 y.o. female, [phone], who presents at [redacted]w[redacted]d weeks gestation. The patient has been followed at  [facility]  Her pregnancy has been complicated by: Patient Active Problem List   Diagnosis Date Noted  . Vaginal delivery 04/20/2018  . Normal postpartum course 04/20/2018  . Postpartum anemia 04/20/2018  . Gestational hypertension 04/18/2018   Hospital course:  Onset of Labor With Vaginal Delivery     32 y.o. yo [id number] at [redacted]w[redacted]d was admitted in Latent Labor on 04/18/2018. Patient had an uncomplicated labor course as follows:  Membrane Rupture Time/Date: 7:12 AM ,04/19/2018   Intrapartum Procedures: Episiotomy: None [1]                                         Lacerations:  None [1]  Patient had a delivery of a Viable infant. 04/19/2018  Information for the patient's newborn:  Kelsay, Haggard [[id number]]  Delivery  Method: Vaginal, Spontaneous(Filed from Delivery Summary)    Pateint had an uncomplicated postpartum course.  She is ambulating, tolerating a regular diet, passing flatus, and urinating well. Patient is discharged home in stable condition on 04/21/18.  Postpartum Day # 3 : S/P NSVD due to spontaneous latent labor with GHTN unmedicated. Patient up ad lib, denies syncope or dizziness. Reports consuming regular diet without issues and denies N/V. Patient reports 0 bowel movement + passing flatus.  Denies issues with urination and reports bleeding is "light."  Patient is Breastfeeding and reports going well.  Desires undecided for postpartum contraception.  Pain is being appropriately managed with use of motrin. Pt BP remianed stable and pt did not require medication for BP. Pt currently denies cp, sob, n, v, vision changes, no RUQ pain nor HA.   Physical exam  Vitals:   04/20/18 0400 04/20/18 1441 04/20/18 2210 04/21/18 0504  BP: 137/85 128/69 137/80 132/87  Pulse: 73 74 85 79  Resp: 16 19 18  18  Temp: 98.2 F (36.8 C) 98 F (36.7 C) 98.6 F (37 C) 98 F (36.7 C)  TempSrc: Axillary Oral Oral Oral  SpO2:   100% 100%  Weight:      Height:       General: alert, cooperative and no distress  Resp: CTA Bi-lat Cardiac: RRR Lochia: appropriate Uterine Fundus: firm Perineum: Intact, no hematomas DVT Evaluation: No evidence of DVT seen on physical exam. Negative Homan's sign. No cords or calf tenderness. No significant calf/ankle edema. DTR 2+, No clonus  Labs: Lab Results  Component Value Date   WBC 10.0 04/20/2018   HGB 9.7 (L) 04/20/2018   HCT 30.2 (L) 04/20/2018   MCV 83.2 04/20/2018   PLT 201 04/20/2018   CMP Latest Ref Rng & Units 04/20/2018  Glucose 70 - 99 mg/dL 95  BUN 6 - 20 mg/dL 8  Creatinine 1.61 - 0.96 mg/dL 0.45  Sodium 409 - 811 mmol/L 138  Potassium 3.5 - 5.1 mmol/L 3.8  Chloride 98 - 111 mmol/L 105  CO2 22 - 32 mmol/L 24  Calcium 8.9 - 10.3 mg/dL 8.9  Total  Protein 6.5 - 8.1 g/dL 5.9(L)  Total Bilirubin 0.3 - 1.2 mg/dL 0.5  Alkaline Phos 38 - 126 U/L 129(H)  AST 15 - 41 U/L 20  ALT 0 - 44 U/L 14    Date of discharge: 04/21/2018 Discharge Diagnoses: Term Pregnancy-delivered and GHTN controlled without meds.  Discharge instruction: per After Visit Summary and "Baby and Me Booklet".  After visit meds:  Allergies as of 04/21/2018   No Known Allergies     Medication List    TAKE these medications   acetaminophen 325 MG tablet Commonly known as:  TYLENOL Take 650 mg by mouth every 6 (six) hours as needed for mild pain or moderate pain.   ferrous sulfate 325 (65 FE) MG tablet Take 1 tablet (325 mg total) by mouth 2 (two) times daily with a meal.   ibuprofen 600 MG tablet Commonly known as:  ADVIL,MOTRIN Take 1 tablet (600 mg total) by mouth every 6 (six) hours.   prenatal multivitamin Tabs tablet Take 1 tablet by mouth daily at 12 noon.   ranitidine 75 MG tablet Commonly known as:  ZANTAC Take 75 mg by mouth daily as needed for heartburn.       Activity:           pelvic rest Advance as tolerated. Pelvic rest for 6 weeks.  Diet:                routine Medications: PNV and Ibuprofen Postpartum contraception: Undecided Condition:  Pt discharge to home with baby in stable GHTN: BP check with Baby love and 1 week PP f/u for BP check.  Anemia: Continue to take PO Iron daily, with vit C and avoid calcium.   Meds: Allergies as of 04/21/2018   No Known Allergies     Medication List    TAKE these medications   acetaminophen 325 MG tablet Commonly known as:  TYLENOL Take 650 mg by mouth every 6 (six) hours as needed for mild pain or moderate pain.   ferrous sulfate 325 (65 FE) MG tablet Take 1 tablet (325 mg total) by mouth 2 (two) times daily with a meal.   ibuprofen 600 MG tablet Commonly known as:  ADVIL,MOTRIN Take 1 tablet (600 mg total) by mouth every 6 (six) hours.   prenatal multivitamin Tabs tablet Take 1 tablet  by mouth daily at 12 noon.  ranitidine 75 MG tablet Commonly known as:  ZANTAC Take 75 mg by mouth daily as needed for heartburn.       Discharge Follow Up:  Follow-up Information    [facility] Follow up.   Specialty:  Obstetrics and Gynecology Why:  1 weeks BP check with provider in office, pt to make appointment as well as a 6 weeks PPV.  Contact information: [address] [place] [address] [phone]           [place], NP-C, CNM 04/21/2018, 6:23 AM  [place], FNP

## 2018-04-20 NOTE — Lactation Note (Signed)
This note was copied from a baby's chart. Lactation Consultation Note Baby 15 hrs old.  experienced BF mom for 1 yr for her 2 other children. Denies difficulty BF.  Mom is breast/formula feeding. Encouraged to BF before giving formula. Mom states she knows how to hand express. Denies painful latching. Encouraged mom to call for questions or assistance.  WH/LC brochure given w/resources, support groups and LC services.  Patient Name: Annette Sullivan ZOXWR'U Date: 04/20/2018 Reason for consult: Initial assessment   Maternal Data Has patient been taught Hand Expression?: Yes Does the patient have breastfeeding experience prior to this delivery?: Yes  Feeding Feeding Type: Breast Fed Length of feed: 10 min  LATCH Score       Type of Nipple: Everted at rest and after stimulation  Comfort (Breast/Nipple): Soft / non-tender  Hold (Positioning): No assistance needed to correctly position infant at breast.     Interventions Interventions: Breast feeding basics reviewed  Lactation Tools Discussed/Used WIC Program: Yes   Consult Status Consult Status: Follow-up Date: 04/21/18 Follow-up type: In-patient    Arvilla Salada, Diamond Nickel 04/20/2018, 5:05 AM

## 2018-04-20 NOTE — Progress Notes (Signed)
Pt reports she did not know what an advance directive was, but when she received further explanation from her nurse, she realized she did not want one.  Please page as further needs arise.  Maryanna Shape. Carley Hammed, M.Div. [facility] Chaplain Pager [phone] Office [phone]

## 2018-04-21 MED ORDER — IBUPROFEN 600 MG PO TABS: 600.0000 mg | ORAL_TABLET | Freq: Four times a day (QID) | ORAL | 0 refills | Status: DC

## 2018-04-21 MED ORDER — FERROUS SULFATE 325 (65 FE) MG PO TABS: 325.0000 mg | ORAL_TABLET | Freq: Two times a day (BID) | ORAL | 3 refills | Status: DC

## 2018-04-21 NOTE — Lactation Note (Signed)
This note was copied from a baby's chart. Lactation Consultation Note Baby 5 hrs old. Mom states baby cluster feeding. Mom is breast/formula feeding. Mom is experienced BF. Denies any difficulty. Reminded of outside recourses, WIC, milk storage, breast massage, breast filling, engorgement, breast support, STS, I&O. Mom states she has no further questions or need for LC before d/c home.  Patient Name: Annette Sullivan ZOXWR'U Date: 04/21/2018 Reason for consult: Follow-up assessment   Maternal Data    Feeding Feeding Type: Breast Fed Length of feed: 20 min  LATCH Score Latch: Grasps breast easily, tongue down, lips flanged, rhythmical sucking.  Audible Swallowing: Spontaneous and intermittent  Type of Nipple: Everted at rest and after stimulation  Comfort (Breast/Nipple): Filling, red/small blisters or bruises, mild/mod discomfort        Interventions Interventions: Breast feeding basics reviewed;Support pillows;Breast massage;Breast compression  Lactation Tools Discussed/Used     Consult Status Consult Status: Complete Date: 04/21/18    [name] 04/21/2018, 4:06 AM

## 2019-11-14 ENCOUNTER — Ambulatory Visit: Payer: Medicaid Other | Attending: Internal Medicine

## 2019-11-14 DIAGNOSIS — Z20822 Contact with and (suspected) exposure to covid-19: Secondary | ICD-10-CM

## 2019-11-15 LAB — NOVEL CORONAVIRUS, NAA: SARS-CoV-2, NAA: DETECTED — AB

## 2019-11-15 LAB — SARS-COV-2, NAA 2 DAY TAT

## 2019-11-16 ENCOUNTER — Telehealth: Payer: Self-pay | Admitting: Nurse Practitioner

## 2019-11-16 ENCOUNTER — Telehealth: Payer: Self-pay | Admitting: Adult Health

## 2019-11-16 NOTE — Telephone Encounter (Signed)
Called patient about her COVID positivity and to screen whether or not she would meet criteria for monoclonal antibody therapy.  She is asymptomatic, and therefore does not meet criteria.    Lillard Anes, NP

## 2019-11-16 NOTE — Telephone Encounter (Signed)
Called to Discuss with patient about Covid symptoms and the use of bamlanivimab, a monoclonal antibody infusion for those with mild to moderate Covid symptoms and at a high risk of hospitalization.     Pt needs to be screened to see if she is qualified for this infusion at the [facility] infusion center due to co-morbid conditions and/or a member of an at-risk group.     Unable to reach pt

## 2020-11-26 ENCOUNTER — Other Ambulatory Visit: Payer: Self-pay

## 2020-11-27 ENCOUNTER — Ambulatory Visit (INDEPENDENT_AMBULATORY_CARE_PROVIDER_SITE_OTHER): Payer: 59 | Admitting: Internal Medicine

## 2020-11-27 ENCOUNTER — Encounter: Payer: Self-pay | Admitting: Internal Medicine

## 2020-11-27 VITALS — BP 120/90 | HR 73 | Temp 98.8°F | Ht 64.0 in | Wt 218.7 lb

## 2020-11-27 DIAGNOSIS — R519 Headache, unspecified: Secondary | ICD-10-CM | POA: Diagnosis not present

## 2020-11-27 DIAGNOSIS — R030 Elevated blood-pressure reading, without diagnosis of hypertension: Secondary | ICD-10-CM | POA: Diagnosis not present

## 2020-11-27 NOTE — Progress Notes (Signed)
New Patient Office Visit     This visit occurred during the SARS-CoV-2 public health emergency.  Safety protocols were in place, including screening questions prior to the visit, additional usage of staff PPE, and extensive cleaning of exam room while observing appropriate contact time as indicated for disinfecting solutions.    CC/Reason for Visit: Establish care, discuss acute concern Previous PCP: Unknown Last Visit: Unknown  HPI: Annette Sullivan is a 35 y.o. female who is coming in today for the above mentioned reasons. Past Medical History is significant for: Gestational hypertension during all of her pregnancies.  She is originally from [place], she is married she has 3 children.  She works Engineering geologist.  She does not smoke, she does not drink, she has no known drug allergies.  Her family history is nonsignificant.  She does not take any routine medications.  Her past surgical history significant for an appendectomy in 2011.  For the past few months she has been having a frontal and occasionally a posterior headache.  It resolves with over-the-counter pain medication.   Past Medical/Surgical History: Past Medical History:  Diagnosis Date  . Anemia   . Anxiety   . Headache(784.0)   . Hypertension   . No pertinent past medical history   . Obesity (BMI 30-39.9)     Past Surgical History:  Procedure Laterality Date  . APPENDECTOMY    . INSERTION OF MESH N/A 12/18/2015   Procedure: INSERTION OF MESH;  Surgeon: Abigail Miyamoto, MD;  Location: [facility] OR;  Service: General;  Laterality: N/A;  . UMBILICAL HERNIA REPAIR N/A 12/18/2015   Procedure: HERNIA REPAIR UMBILICAL ADULT WITH MESH ;  Surgeon: Abigail Miyamoto, MD;  Location: [facility] OR;  Service: General;  Laterality: N/A;    Social History:  reports that she has never smoked. She has never used smokeless tobacco. She reports that she does not drink alcohol and does not use drugs.  Allergies: No Known Allergies  Family History:   Family History  Problem Relation Age of Onset  . Anesthesia problems Neg Hx      Current Outpatient Medications:  .  acetaminophen (TYLENOL) 325 MG tablet, Take 650 mg by mouth every 6 (six) hours as needed for mild pain or moderate pain., Disp: , Rfl:  .  ibuprofen (ADVIL,MOTRIN) 600 MG tablet, Take 1 tablet (600 mg total) by mouth every 6 (six) hours., Disp: 30 tablet, Rfl: 0  Review of Systems:  Constitutional: Denies fever, chills, diaphoresis, appetite change and fatigue.  HEENT: Denies photophobia, eye pain, redness, hearing loss, ear pain, congestion, sore throat, rhinorrhea, sneezing, mouth sores, trouble swallowing, neck pain, neck stiffness and tinnitus.   Respiratory: Denies SOB, DOE, cough, chest tightness,  and wheezing.   Cardiovascular: Denies chest pain, palpitations and leg swelling.  Gastrointestinal: Denies nausea, vomiting, abdominal pain, diarrhea, constipation, blood in stool and abdominal distention.  Genitourinary: Denies dysuria, urgency, frequency, hematuria, flank pain and difficulty urinating.  Endocrine: Denies: hot or cold intolerance, sweats, changes in hair or nails, polyuria, polydipsia. Musculoskeletal: Denies myalgias, back pain, joint swelling, arthralgias and gait problem.  Skin: Denies pallor, rash and wound.  Neurological: Denies dizziness, seizures, syncope, weakness, light-headedness, numbness. Hematological: Denies adenopathy. Easy bruising, personal or family bleeding history  Psychiatric/Behavioral: Denies suicidal ideation, mood changes, confusion, nervousness, sleep disturbance and agitation    Physical Exam: Vitals:   11/27/20 1533  BP: 120/90  Pulse: 73  Temp: 98.8 F (37.1 C)  TempSrc: Oral  SpO2: 97%  Weight: 218 lb 11.2 oz (99.2 kg)  Height: 5\' 4"  (1.626 m)   Body mass index is 37.54 kg/m.  Constitutional: NAD, calm, comfortable Eyes: PERRL, lids and conjunctivae normal ENMT: Mucous membranes are moist.  Respiratory:  clear to auscultation bilaterally, no wheezing, no crackles. Normal respiratory effort. No accessory muscle use.  Cardiovascular: Regular rate and rhythm, no murmurs / rubs / gallops. No extremity edema.  Neurologic: Grossly intact and nonfocal Psychiatric: Normal judgment and insight. Alert and oriented x 3. Normal mood.    Impression and Plan:  Elevated BP without diagnosis of hypertension -I have asked her to do ambulatory blood pressure measurements and to return in 6 weeks for follow-up.  Nonintractable headache, unspecified chronicity pattern, unspecified headache type -Possibly related to elevated blood pressure, have also advised she get an eye exam. -For now continue OTC pain meds as she is.    Patient Instructions  -Nice seeing you today!!  -Start checking blood pressure daily.  -Schedule follow up in 6 weeks. Please bring in your BP numbers.     , MD [facility]

## 2020-11-27 NOTE — Patient Instructions (Signed)
-  Nice seeing you today!!  -Start checking blood pressure daily.  -Schedule follow up in 6 weeks. Please bring in your BP numbers.

## 2022-02-18 ENCOUNTER — Ambulatory Visit: Payer: 59 | Admitting: Family Medicine

## 2022-05-06 ENCOUNTER — Ambulatory Visit (INDEPENDENT_AMBULATORY_CARE_PROVIDER_SITE_OTHER): Payer: 59 | Admitting: Family Medicine

## 2022-05-06 ENCOUNTER — Encounter: Payer: Self-pay | Admitting: Family Medicine

## 2022-05-06 VITALS — BP 134/87 | HR 62 | Temp 97.7°F | Resp 16 | Ht 64.0 in | Wt 213.0 lb

## 2022-05-06 DIAGNOSIS — Z0000 Encounter for general adult medical examination without abnormal findings: Secondary | ICD-10-CM

## 2022-05-06 DIAGNOSIS — Z13228 Encounter for screening for other metabolic disorders: Secondary | ICD-10-CM

## 2022-05-06 DIAGNOSIS — O26873 Cervical shortening, third trimester: Secondary | ICD-10-CM | POA: Insufficient documentation

## 2022-05-06 DIAGNOSIS — Z13220 Encounter for screening for lipoid disorders: Secondary | ICD-10-CM

## 2022-05-06 DIAGNOSIS — Z7689 Persons encountering health services in other specified circumstances: Secondary | ICD-10-CM

## 2022-05-06 DIAGNOSIS — Z1329 Encounter for screening for other suspected endocrine disorder: Secondary | ICD-10-CM | POA: Diagnosis not present

## 2022-05-06 DIAGNOSIS — Z130 Encounter for screening for diseases of the blood and blood-forming organs and certain disorders involving the immune mechanism: Secondary | ICD-10-CM

## 2022-05-06 MED ORDER — LISINOPRIL 5 MG PO TABS: 5.0000 mg | ORAL_TABLET | Freq: Every day | ORAL | 3 refills | Status: DC

## 2022-05-06 NOTE — Progress Notes (Signed)
New Patient Office Visit  Subjective    Patient ID: Annette Sullivan, female    DOB: 26-Jul-1986  Age: 36 y.o. MRN: [id number]  CC:  Chief Complaint  Patient presents with   Establish Care    HPI Annette Sullivan presents to establish care and for routine annual exam. Patient denies acute complaints or concerns.    Outpatient Encounter Medications as of 05/06/2022  Medication Sig   acetaminophen (TYLENOL) 325 MG tablet Take 650 mg by mouth every 6 (six) hours as needed for mild pain or moderate pain.   ibuprofen (ADVIL,MOTRIN) 600 MG tablet Take 1 tablet (600 mg total) by mouth every 6 (six) hours.   lisinopril (ZESTRIL) 5 MG tablet Take 1 tablet (5 mg total) by mouth daily.   No facility-administered encounter medications on file as of 05/06/2022.    Past Medical History:  Diagnosis Date   Anemia    Anxiety    Headache(784.0)    Hypertension    No pertinent past medical history    Obesity (BMI 30-39.9)     Past Surgical History:  Procedure Laterality Date   APPENDECTOMY     INSERTION OF MESH N/A 12/18/2015   Procedure: INSERTION OF MESH;  Surgeon: Coralie Keens, MD;  Location: [place];  Service: General;  Laterality: N/A;   UMBILICAL HERNIA REPAIR N/A 12/18/2015   Procedure: HERNIA REPAIR UMBILICAL ADULT WITH MESH ;  Surgeon: Coralie Keens, MD;  Location: [place];  Service: General;  Laterality: N/A;    Family History  Problem Relation Age of Onset   Anesthesia problems Neg Hx     Social History   Socioeconomic History   Marital status: Married    Spouse name: Not on file   Number of children: Not on file   Years of education: Not on file   Highest education level: Not on file  Occupational History   Not on file  Tobacco Use   Smoking status: Never   Smokeless tobacco: Never  Vaping Use   Vaping Use: Never used  Substance and Sexual Activity   Alcohol use: No   Drug use: No   Sexual activity: Yes  Other Topics Concern   Not on file  Social History  Narrative   Not on file   Social Determinants of Health   Financial Resource Strain: Not on file  Food Insecurity: Not on file  Transportation Needs: Not on file  Physical Activity: Not on file  Stress: Not on file  Social Connections: Not on file  Intimate Partner Violence: Not on file    Review of Systems  All other systems reviewed and are negative.       Objective    BP 134/87   Pulse 62   Temp 97.7 F (36.5 C) (Oral)   Resp 16   Ht 5' 4" (1.626 m)   Wt 213 lb (96.6 kg)   SpO2 98%   BMI 36.56 kg/m   Physical Exam Vitals and nursing note reviewed.  Constitutional:      General: She is not in acute distress. HENT:     Head: Normocephalic and atraumatic.     Right Ear: Tympanic membrane, ear canal and external ear normal.     Left Ear: Tympanic membrane, ear canal and external ear normal.     Nose: Nose normal.     Mouth/Throat:     Mouth: Mucous membranes are moist.     Pharynx: Oropharynx is clear.  Eyes:  Conjunctiva/sclera: Conjunctivae normal.     Pupils: Pupils are equal, round, and reactive to light.  Neck:     Thyroid: No thyromegaly.  Cardiovascular:     Rate and Rhythm: Normal rate and regular rhythm.     Heart sounds: Normal heart sounds. No murmur heard. Pulmonary:     Effort: Pulmonary effort is normal. No respiratory distress.     Breath sounds: Normal breath sounds.  Abdominal:     General: There is no distension.     Palpations: Abdomen is soft. There is no mass.     Tenderness: There is no abdominal tenderness.  Musculoskeletal:        General: Normal range of motion.     Cervical back: Normal range of motion and neck supple.  Skin:    General: Skin is warm and dry.  Neurological:     General: No focal deficit present.     Mental Status: She is alert and oriented to person, place, and time.  Psychiatric:        Mood and Affect: Mood normal.        Behavior: Behavior normal.         Assessment & Plan:   1. Annual  physical exam  - CMP14+EGFR  2. Screening for deficiency anemia  - CBC with Differential  3. Screening for lipid disorders  - Lipid Panel  4. Screening for endocrine/metabolic/immunity disorders  - Hemoglobin A1c - TSH  5. Encounter to establish care     No follow-ups on file.   Becky Sax, MD

## 2022-05-08 LAB — CMP14+EGFR
ALT: 15 IU/L (ref 0–32)
AST: 24 IU/L (ref 0–40)
Albumin/Globulin Ratio: 1.9 (ref 1.2–2.2)
Albumin: 4.7 g/dL (ref 3.9–4.9)
Alkaline Phosphatase: 46 IU/L (ref 44–121)
BUN/Creatinine Ratio: 13 (ref 9–23)
BUN: 9 mg/dL (ref 6–20)
Bilirubin Total: 0.4 mg/dL (ref 0.0–1.2)
CO2: 17 mmol/L — ABNORMAL LOW (ref 20–29)
Calcium: 9.3 mg/dL (ref 8.7–10.2)
Chloride: 105 mmol/L (ref 96–106)
Creatinine, Ser: 0.71 mg/dL (ref 0.57–1.00)
Globulin, Total: 2.5 g/dL (ref 1.5–4.5)
Glucose: 74 mg/dL (ref 70–99)
Potassium: 4.9 mmol/L (ref 3.5–5.2)
Sodium: 145 mmol/L — ABNORMAL HIGH (ref 134–144)
Total Protein: 7.2 g/dL (ref 6.0–8.5)
eGFR: 113 mL/min/{1.73_m2} (ref >59)

## 2022-05-08 LAB — LIPID PANEL
Chol/HDL Ratio: 3.7 ratio (ref 0.0–4.4)
Cholesterol, Total: 225 mg/dL — ABNORMAL HIGH (ref 100–199)
HDL: 61 mg/dL (ref >39)
LDL Chol Calc (NIH): 155 mg/dL — ABNORMAL HIGH (ref 0–99)
Triglycerides: 54 mg/dL (ref 0–149)
VLDL Cholesterol Cal: 9 mg/dL (ref 5–40)

## 2022-05-08 LAB — CBC WITH DIFFERENTIAL/PLATELET
Basophils Absolute: 0 10*3/uL (ref 0.0–0.2)
Basos: 1 %
EOS (ABSOLUTE): 0.1 10*3/uL (ref 0.0–0.4)
Eos: 4 %
Hematocrit: 38.4 % (ref 34.0–46.6)
Hemoglobin: 12.2 g/dL (ref 11.1–15.9)
Immature Grans (Abs): 0 10*3/uL (ref 0.0–0.1)
Immature Granulocytes: 0 %
Lymphocytes Absolute: 1.8 10*3/uL (ref 0.7–3.1)
Lymphs: 49 %
MCH: 27.1 pg (ref 26.6–33.0)
MCHC: 31.8 g/dL (ref 31.5–35.7)
MCV: 85 fL (ref 79–97)
Monocytes Absolute: 0.2 10*3/uL (ref 0.1–0.9)
Monocytes: 6 %
Neutrophils Absolute: 1.4 10*3/uL (ref 1.4–7.0)
Neutrophils: 40 %
Platelets: 208 10*3/uL (ref 150–450)
RBC: 4.51 x10E6/uL (ref 3.77–5.28)
RDW: 11.5 % — ABNORMAL LOW (ref 11.7–15.4)
WBC: 3.6 10*3/uL (ref 3.4–10.8)

## 2022-05-08 LAB — HEMOGLOBIN A1C
Est. average glucose Bld gHb Est-mCnc: 91 mg/dL
Hgb A1c MFr Bld: 4.8 % (ref 4.8–5.6)

## 2022-05-08 LAB — TSH: TSH: 1.43 u[IU]/mL (ref 0.450–4.500)

## 2022-06-14 ENCOUNTER — Ambulatory Visit (INDEPENDENT_AMBULATORY_CARE_PROVIDER_SITE_OTHER): Payer: 59 | Admitting: Family Medicine

## 2022-06-14 VITALS — BP 135/84 | HR 69 | Temp 98.0°F | Resp 16 | Ht 64.0 in | Wt 216.8 lb

## 2022-06-14 DIAGNOSIS — I10 Essential (primary) hypertension: Secondary | ICD-10-CM | POA: Diagnosis not present

## 2022-06-14 MED ORDER — LISINOPRIL 5 MG PO TABS: 5.0000 mg | ORAL_TABLET | Freq: Every day | ORAL | 1 refills | Status: DC

## 2022-06-14 MED ORDER — IBUPROFEN 600 MG PO TABS: 600.0000 mg | ORAL_TABLET | Freq: Four times a day (QID) | ORAL | 5 refills | Status: DC

## 2022-06-14 MED ORDER — METOPROLOL SUCCINATE ER 25 MG PO TB24: 25.0000 mg | ORAL_TABLET | Freq: Every day | ORAL | 0 refills | Status: DC

## 2022-06-14 NOTE — Progress Notes (Unsigned)
Patient is here for f/u with provider. Patient bp is WNL and there are no concerns  today

## 2022-06-15 ENCOUNTER — Encounter: Payer: Self-pay | Admitting: Family Medicine

## 2022-06-15 NOTE — Progress Notes (Signed)
Established Patient Office Visit  Subjective    Patient ID: Annette Sullivan, female    DOB: 1985-10-23  Age: 36 y.o. MRN: [id number]  CC:  Chief Complaint  Patient presents with   Follow-up    HPI Annette Sullivan presents for follow up of hypertension. Patient denies acute complaints or concerns.    Outpatient Encounter Medications as of 06/14/2022  Medication Sig   acetaminophen (TYLENOL) 325 MG tablet Take 650 mg by mouth every 6 (six) hours as needed for mild pain or moderate pain.   metoprolol succinate (TOPROL-XL) 25 MG 24 hr tablet Take 1 tablet (25 mg total) by mouth daily.   [DISCONTINUED] ibuprofen (ADVIL,MOTRIN) 600 MG tablet Take 1 tablet (600 mg total) by mouth every 6 (six) hours.   [DISCONTINUED] lisinopril (ZESTRIL) 5 MG tablet Take 1 tablet (5 mg total) by mouth daily.   ibuprofen (ADVIL) 600 MG tablet Take 1 tablet (600 mg total) by mouth every 6 (six) hours.   [DISCONTINUED] lisinopril (ZESTRIL) 5 MG tablet Take 1 tablet (5 mg total) by mouth daily.   [DISCONTINUED] lisinopril (ZESTRIL) 5 MG tablet Take 1 tablet (5 mg total) by mouth daily.   [DISCONTINUED] lisinopril (ZESTRIL) 5 MG tablet Take 1 tablet (5 mg total) by mouth daily.   No facility-administered encounter medications on file as of 06/14/2022.    Past Medical History:  Diagnosis Date   Anemia    Anxiety    Headache(784.0)    Hypertension    No pertinent past medical history    Obesity (BMI 30-39.9)     Past Surgical History:  Procedure Laterality Date   APPENDECTOMY     INSERTION OF MESH N/A 12/18/2015   Procedure: INSERTION OF MESH;  Surgeon: Abigail Miyamoto, MD;  Location: [facility] OR;  Service: General;  Laterality: N/A;   UMBILICAL HERNIA REPAIR N/A 12/18/2015   Procedure: HERNIA REPAIR UMBILICAL ADULT WITH MESH ;  Surgeon: Abigail Miyamoto, MD;  Location: [facility] OR;  Service: General;  Laterality: N/A;    Family History  Problem Relation Age of Onset   Anesthesia problems Neg Hx     Social  History   Socioeconomic History   Marital status: Married    Spouse name: Not on file   Number of children: Not on file   Years of education: Not on file   Highest education level: Not on file  Occupational History   Not on file  Tobacco Use   Smoking status: Never   Smokeless tobacco: Never  Vaping Use   Vaping Use: Never used  Substance and Sexual Activity   Alcohol use: No   Drug use: No   Sexual activity: Yes  Other Topics Concern   Not on file  Social History Narrative   Not on file   Social Determinants of Health   Financial Resource Strain: Not on file  Food Insecurity: Not on file  Transportation Needs: Not on file  Physical Activity: Not on file  Stress: Not on file  Social Connections: Not on file  Intimate Partner Violence: Not on file    Review of Systems  All other systems reviewed and are negative.       Objective    BP 135/84   Pulse 69   Temp 98 F (36.7 C) (Oral)   Resp 16   Ht 5\' 4"  (1.626 m)   Wt 216 lb 12.8 oz (98.3 kg)   SpO2 98%   BMI 37.21 kg/m   Physical Exam Vitals  and nursing note reviewed.  Constitutional:      General: She is not in acute distress. Cardiovascular:     Rate and Rhythm: Normal rate and regular rhythm.  Pulmonary:     Effort: Pulmonary effort is normal.     Breath sounds: Normal breath sounds.  Abdominal:     Palpations: Abdomen is soft.     Tenderness: There is no abdominal tenderness.  Neurological:     General: No focal deficit present.     Mental Status: She is alert and oriented to person, place, and time.         Assessment & Plan:   1. Essential hypertension Appears stable. Continue. Meds refilled.     Return in about 6 months (around 12/13/2022) for follow up.   Annette Raymond, MD

## 2022-07-26 LAB — OB RESULTS CONSOLE RPR: RPR: NONREACTIVE

## 2022-08-06 LAB — OB RESULTS CONSOLE HEPATITIS B SURFACE ANTIGEN: Hepatitis B Surface Ag: NEGATIVE

## 2022-08-06 LAB — OB RESULTS CONSOLE HIV ANTIBODY (ROUTINE TESTING)
HIV: NONREACTIVE
HIV: NONREACTIVE

## 2022-08-06 LAB — OB RESULTS CONSOLE RUBELLA ANTIBODY, IGM: Rubella: IMMUNE

## 2022-09-14 ENCOUNTER — Other Ambulatory Visit: Payer: Self-pay

## 2022-09-14 ENCOUNTER — Encounter (HOSPITAL_COMMUNITY): Payer: Self-pay | Admitting: Obstetrics & Gynecology

## 2022-09-14 ENCOUNTER — Inpatient Hospital Stay (HOSPITAL_COMMUNITY)
Admission: AD | Admit: 2022-09-14 | Discharge: 2022-09-14 | Disposition: A | Discharge status: Home / Self Care | Payer: 59 | Attending: Obstetrics & Gynecology | Admitting: Obstetrics & Gynecology

## 2022-09-14 DIAGNOSIS — O99891 Other specified diseases and conditions complicating pregnancy: Secondary | ICD-10-CM | POA: Insufficient documentation

## 2022-09-14 DIAGNOSIS — O99212 Obesity complicating pregnancy, second trimester: Secondary | ICD-10-CM | POA: Diagnosis not present

## 2022-09-14 DIAGNOSIS — O09522 Supervision of elderly multigravida, second trimester: Secondary | ICD-10-CM | POA: Insufficient documentation

## 2022-09-14 DIAGNOSIS — O10912 Unspecified pre-existing hypertension complicating pregnancy, second trimester: Secondary | ICD-10-CM | POA: Insufficient documentation

## 2022-09-14 DIAGNOSIS — R300 Dysuria: Secondary | ICD-10-CM | POA: Diagnosis not present

## 2022-09-14 DIAGNOSIS — Z3A15 15 weeks gestation of pregnancy: Secondary | ICD-10-CM | POA: Diagnosis not present

## 2022-09-14 DIAGNOSIS — N949 Unspecified condition associated with female genital organs and menstrual cycle: Secondary | ICD-10-CM

## 2022-09-14 DIAGNOSIS — O26892 Other specified pregnancy related conditions, second trimester: Secondary | ICD-10-CM | POA: Insufficient documentation

## 2022-09-14 LAB — URINALYSIS, ROUTINE W REFLEX MICROSCOPIC
Bilirubin Urine: NEGATIVE
Glucose, UA: NEGATIVE mg/dL
Hgb urine dipstick: NEGATIVE
Ketones, ur: NEGATIVE mg/dL
Leukocytes,Ua: NEGATIVE
Nitrite: NEGATIVE
Protein, ur: NEGATIVE mg/dL
Specific Gravity, Urine: 1.019 (ref 1.005–1.030)
pH: 5 (ref 5.0–8.0)

## 2022-09-14 MED ORDER — ACETAMINOPHEN 325 MG PO TABS: 650.0000 mg | ORAL_TABLET | ORAL | 0 refills | Status: AC | PRN

## 2022-09-14 MED ORDER — ACETAMINOPHEN 500 MG PO TABS
1000.0000 mg | ORAL_TABLET | Freq: Once | ORAL | Status: AC
  Administered 2022-09-14: 1000 mg via ORAL
  Filled 2022-09-14: qty 2

## 2022-09-14 NOTE — MAU Note (Signed)
Annette Sullivan is a 37 y.o. at 23w0dhere in MAU reporting: lower abdominal pain since about 12:30 this afternoon. Pain is constant. No bleeding or LOF. Hx of short cervix.  Onset of complaint: today  Pain score: 5/10  Vitals:   09/14/22 1642  BP: 139/89  Pulse: 77  Resp: 16  Temp: 99.3 F (37.4 C)  SpO2: 100%     FHT:166  Lab orders placed from triage: UA

## 2022-09-14 NOTE — MAU Provider Note (Signed)
History     CSN: [id number]  Arrival date and time: 09/14/22 1620   Event Date/Time   First Provider Initiated Contact with Patient 09/14/22 1659      Chief Complaint  Patient presents with   Abdominal Pain   HPI Annette Sullivan is a 37 y.o. [id number] at 83w0dwho presents to MAU with chief complaint of bilateral lower abdominal pain. This is a new problem, onset today at lunchtime. Pain is constant but slightly improved at rest. She has not taken medication or tried other treatments for this complaint.  Patient also reports new onset dysuria. She denies hematuria, flank pain, and fever. She denies history of UTIs.   OB History     Gravida  5   Para  3   Term  3   Preterm  0   AB  1   Living  3      SAB  0   IAB  1   Ectopic  0   Multiple  0   Live Births  3           Past Medical History:  Diagnosis Date   Anemia    Anxiety    Headache(784.0)    Hypertension    No pertinent past medical history    Obesity (BMI 30-39.9)     Past Surgical History:  Procedure Laterality Date   APPENDECTOMY     INSERTION OF MESH N/A 12/18/2015   Procedure: INSERTION OF MESH;  Surgeon: DCoralie Keens MD;  [place]  Service: General;  Laterality: N/A;   UMBILICAL HERNIA REPAIR N/A 12/18/2015   Procedure: HERNIA REPAIR UMBILICAL ADULT WITH MESH ;  Surgeon: DCoralie Keens MD;  [place] [facility]  Service: General;  Laterality: N/A;    Family History  Problem Relation Age of Onset   Anesthesia problems Neg Hx     Social History   Tobacco Use   Smoking status: Never   Smokeless tobacco: Never  Vaping Use   Vaping Use: Never used  Substance Use Topics   Alcohol use: No   Drug use: No    Allergies: No Known Allergies  Medications Prior to Admission  Medication Sig Dispense Refill Last Dose   aspirin EC 81 MG tablet Take 81 mg by mouth daily. Swallow whole.   09/13/2022   cholecalciferol (VITAMIN D3) 25 MCG (1000 UNIT) tablet Take 4,000 Units by mouth  daily.   09/13/2022   Prenatal Vit-Fe Fumarate-FA (MULTIVITAMIN-PRENATAL) 27-0.8 MG TABS tablet Take 1 tablet by mouth daily at 12 noon.   09/13/2022   ibuprofen (ADVIL) 600 MG tablet Take 1 tablet (600 mg total) by mouth every 6 (six) hours. 60 tablet 5    metoprolol succinate (TOPROL-XL) 25 MG 24 hr tablet Take 1 tablet (25 mg total) by mouth daily. 90 tablet 0    [DISCONTINUED] acetaminophen (TYLENOL) 325 MG tablet Take 650 mg by mouth every 6 (six) hours as needed for mild pain or moderate pain.       Review of Systems  Gastrointestinal:  Positive for abdominal pain.  Genitourinary:  Positive for dysuria.  All other systems reviewed and are negative.  Physical Exam   Blood pressure 139/89, pulse 77, temperature 99.3 F (37.4 C), temperature source Oral, resp. rate 16, height 5' 4"$  (1.626 m), weight 98.8 kg, SpO2 100 %.  Physical Exam Vitals and nursing note reviewed. Exam conducted with a chaperone present.  Constitutional:      Appearance: She is well-developed. She  is not ill-appearing.  Cardiovascular:     Rate and Rhythm: Normal rate and regular rhythm.     Heart sounds: Normal heart sounds.  Pulmonary:     Effort: Pulmonary effort is normal.     Breath sounds: Normal breath sounds.  Abdominal:     General: Bowel sounds are normal. There is no distension.     Palpations: Abdomen is soft.     Tenderness: There is no abdominal tenderness.     Comments: Gravid  Neurological:     Mental Status: She is alert and oriented to person, place, and time.  Psychiatric:        Mood and Affect: Mood normal.        Behavior: Behavior normal.     MAU Course  Procedures  MDM Orders Placed This Encounter  Procedures   Culture, OB Urine   Urinalysis, Routine w reflex microscopic -Urine, Clean Catch   Discharge patient   Patient Vitals for the past 24 hrs:  BP Temp Temp src Pulse Resp SpO2 Height Weight  09/14/22 1822 139/70 -- -- 71 16 100 % -- --  09/14/22 1642 139/89 99.3 F  (37.4 C) Oral 77 16 100 % -- --  09/14/22 1634 -- -- -- -- -- -- 5' 4"$  (1.626 m) 98.8 kg   Results for orders placed or performed during the hospital encounter of 09/14/22 (from the past 24 hour(s))  Urinalysis, Routine w reflex microscopic -Urine, Clean Catch     Status: Abnormal   Collection Time: 09/14/22  5:02 PM  Result Value Ref Range   Color, Urine YELLOW YELLOW   APPearance HAZY (A) CLEAR   Specific Gravity, Urine 1.019 1.005 - 1.030   pH 5.0 5.0 - 8.0   Glucose, UA NEGATIVE NEGATIVE mg/dL   Hgb urine dipstick NEGATIVE NEGATIVE   Bilirubin Urine NEGATIVE NEGATIVE   Ketones, ur NEGATIVE NEGATIVE mg/dL   Protein, ur NEGATIVE NEGATIVE mg/dL   Nitrite NEGATIVE NEGATIVE   Leukocytes,Ua NEGATIVE NEGATIVE   Assessment and Plan  --37 y.o. [id number] at [redacted]w[redacted]d --FHT 166 by Doppler --Round ligament pain --Normal UA, urine culture sent due to report of dysuria in pregnancy --Discharge home in stable condition  SDarlina Rumpf MSA, MSN, CNM

## 2022-09-14 NOTE — Discharge Instructions (Signed)
Round Ligament Pain during Pregnancy Many women will experience a type of pain referred to as "round ligament pain" during their pregnancy. This is associated with abdominal pain or discomfort. Since any type of abdominal pain during pregnancy can be disconcerting, it is important to talk about round ligament pain to relieve any anxiety or fears you may have regarding the symptoms you are feeling. Round ligament pain is due to normal changes that take place in the body during pregnancy. It is caused by stretching of the round ligaments attached to the uterus. More commonly it occurs on the right side of the pelvis. Round Ligament: An Overview Typically in the non-pregnant state the uterus is about the size of an apple or pear. There are thick ligaments which hold the uterus in place in the abdomen, referred to as round ligaments. During pregnancy, your uterus will expand in size and weight, and the ligaments supporting it will have to stretch, becoming longer and thinner. As these ligaments pull and tug they may irritate nearby nerve fibers, which causes pain. The severity of the pain in some cases can seem extreme. Some common symptoms of round ligament pain include:  Ligament spasms or contractions/cramps that trigger a sharp pain typically on the right side of the abdomen.  Pain upon waking or suddenly rolling over in your sleep.  Pain in the abdomen that is sharp brought on by exercise or other vigorous activity. Similar Problems Round ligament pain is often mistaken for other medical conditions because the symptoms are similar. Acute abdominal pain during pregnancy may also be a sign of other conditions including:  Abdominal cramps - Some abdominal pain is simply caused by change in bowel habits associated with pregnancy. Gas is a common problem that can cause sharp, shooting pain. You should always seek out medical care if your pain is accompanied by fever, chills, pain  upon urination or if you have difficulty walking. Further exams and tests will be conducted to ensure that you do not have a more serious condition. It is not uncommon for women with lower abdominal pain to have a urinary tract infection, thus you may also be asked for a urine sample. Treatment If all other conditions are ruled out you can treat your round ligament pain relatively easily. You may be advised to take some acetaminophen (Tylenol) to reduce the severity of any persistent pain and asked to reduce your activity level. You can apply a heating pad to the area of pain or take a warm bath. Lying on the opposite side of the pain may help as well. Most women will find relief from round ligament pain simply by altering their daily routines slightly. The good news is round ligament pain will disappear completely once you have given birth to your child!

## 2022-09-16 LAB — CULTURE, OB URINE: Culture: 10000 — AB

## 2022-09-23 ENCOUNTER — Other Ambulatory Visit: Payer: Self-pay | Admitting: Obstetrics and Gynecology

## 2022-09-23 DIAGNOSIS — Z363 Encounter for antenatal screening for malformations: Secondary | ICD-10-CM

## 2022-10-12 ENCOUNTER — Ambulatory Visit: Payer: 59 | Admitting: *Deleted

## 2022-10-12 ENCOUNTER — Other Ambulatory Visit: Payer: Self-pay | Admitting: *Deleted

## 2022-10-12 ENCOUNTER — Ambulatory Visit: Payer: 59 | Attending: Obstetrics and Gynecology

## 2022-10-12 VITALS — BP 133/55 | HR 86

## 2022-10-12 DIAGNOSIS — O132 Gestational [pregnancy-induced] hypertension without significant proteinuria, second trimester: Secondary | ICD-10-CM

## 2022-10-12 DIAGNOSIS — Z362 Encounter for other antenatal screening follow-up: Secondary | ICD-10-CM

## 2022-10-12 DIAGNOSIS — Z363 Encounter for antenatal screening for malformations: Secondary | ICD-10-CM | POA: Insufficient documentation

## 2022-10-12 DIAGNOSIS — Z8759 Personal history of other complications of pregnancy, childbirth and the puerperium: Secondary | ICD-10-CM

## 2022-10-12 DIAGNOSIS — O99212 Obesity complicating pregnancy, second trimester: Secondary | ICD-10-CM

## 2022-11-09 ENCOUNTER — Encounter: Payer: Self-pay | Admitting: *Deleted

## 2022-11-09 ENCOUNTER — Ambulatory Visit: Payer: 59 | Attending: Obstetrics

## 2022-11-09 ENCOUNTER — Ambulatory Visit: Payer: 59 | Admitting: *Deleted

## 2022-11-09 ENCOUNTER — Other Ambulatory Visit: Payer: Self-pay | Admitting: *Deleted

## 2022-11-09 DIAGNOSIS — O09292 Supervision of pregnancy with other poor reproductive or obstetric history, second trimester: Secondary | ICD-10-CM

## 2022-11-09 DIAGNOSIS — Z362 Encounter for other antenatal screening follow-up: Secondary | ICD-10-CM | POA: Insufficient documentation

## 2022-11-09 DIAGNOSIS — O99212 Obesity complicating pregnancy, second trimester: Secondary | ICD-10-CM | POA: Diagnosis not present

## 2022-11-09 DIAGNOSIS — O10919 Unspecified pre-existing hypertension complicating pregnancy, unspecified trimester: Secondary | ICD-10-CM

## 2022-11-09 DIAGNOSIS — Z3A23 23 weeks gestation of pregnancy: Secondary | ICD-10-CM

## 2022-11-09 DIAGNOSIS — O09523 Supervision of elderly multigravida, third trimester: Secondary | ICD-10-CM

## 2022-11-09 DIAGNOSIS — O10012 Pre-existing essential hypertension complicating pregnancy, second trimester: Secondary | ICD-10-CM | POA: Diagnosis not present

## 2022-11-09 DIAGNOSIS — Z8759 Personal history of other complications of pregnancy, childbirth and the puerperium: Secondary | ICD-10-CM | POA: Insufficient documentation

## 2022-11-09 DIAGNOSIS — O09293 Supervision of pregnancy with other poor reproductive or obstetric history, third trimester: Secondary | ICD-10-CM

## 2022-11-09 DIAGNOSIS — O09522 Supervision of elderly multigravida, second trimester: Secondary | ICD-10-CM

## 2022-11-09 DIAGNOSIS — E669 Obesity, unspecified: Secondary | ICD-10-CM

## 2022-11-22 ENCOUNTER — Encounter: Payer: Self-pay | Admitting: Family Medicine

## 2022-12-11 ENCOUNTER — Inpatient Hospital Stay (HOSPITAL_COMMUNITY)
Admission: AD | Admit: 2022-12-11 | Discharge: 2022-12-11 | Disposition: A | Discharge status: Home / Self Care | Payer: 59 | Attending: Obstetrics and Gynecology | Admitting: Obstetrics and Gynecology

## 2022-12-11 ENCOUNTER — Encounter (HOSPITAL_COMMUNITY): Payer: Self-pay | Admitting: Obstetrics and Gynecology

## 2022-12-11 DIAGNOSIS — O26892 Other specified pregnancy related conditions, second trimester: Secondary | ICD-10-CM

## 2022-12-11 DIAGNOSIS — O212 Late vomiting of pregnancy: Secondary | ICD-10-CM | POA: Diagnosis not present

## 2022-12-11 DIAGNOSIS — Z3A27 27 weeks gestation of pregnancy: Secondary | ICD-10-CM | POA: Insufficient documentation

## 2022-12-11 DIAGNOSIS — O133 Gestational [pregnancy-induced] hypertension without significant proteinuria, third trimester: Secondary | ICD-10-CM | POA: Insufficient documentation

## 2022-12-11 DIAGNOSIS — Z113 Encounter for screening for infections with a predominantly sexual mode of transmission: Secondary | ICD-10-CM | POA: Diagnosis not present

## 2022-12-11 DIAGNOSIS — O132 Gestational [pregnancy-induced] hypertension without significant proteinuria, second trimester: Secondary | ICD-10-CM

## 2022-12-11 DIAGNOSIS — O10912 Unspecified pre-existing hypertension complicating pregnancy, second trimester: Secondary | ICD-10-CM | POA: Insufficient documentation

## 2022-12-11 DIAGNOSIS — R109 Unspecified abdominal pain: Secondary | ICD-10-CM

## 2022-12-11 DIAGNOSIS — O219 Vomiting of pregnancy, unspecified: Secondary | ICD-10-CM

## 2022-12-11 LAB — COMPREHENSIVE METABOLIC PANEL
ALT: 18 U/L (ref 0–44)
AST: 19 U/L (ref 15–41)
Albumin: 3 g/dL — ABNORMAL LOW (ref 3.5–5.0)
Alkaline Phosphatase: 48 U/L (ref 38–126)
Anion gap: 9 (ref 5–15)
BUN: 7 mg/dL (ref 6–20)
CO2: 23 mmol/L (ref 22–32)
Calcium: 9.3 mg/dL (ref 8.9–10.3)
Chloride: 103 mmol/L (ref 98–111)
Creatinine, Ser: 0.55 mg/dL (ref 0.44–1.00)
GFR, Estimated: >60 mL/min (ref >60)
Glucose, Bld: 102 mg/dL — ABNORMAL HIGH (ref 70–99)
Potassium: 3.4 mmol/L — ABNORMAL LOW (ref 3.5–5.1)
Sodium: 135 mmol/L (ref 135–145)
Total Bilirubin: 0.8 mg/dL (ref 0.3–1.2)
Total Protein: 6.7 g/dL (ref 6.5–8.1)

## 2022-12-11 LAB — CBC
HCT: 31.5 % — ABNORMAL LOW (ref 36.0–46.0)
Hemoglobin: 10.2 g/dL — ABNORMAL LOW (ref 12.0–15.0)
MCH: 27.2 pg (ref 26.0–34.0)
MCHC: 32.4 g/dL (ref 30.0–36.0)
MCV: 84 fL (ref 80.0–100.0)
Platelets: 257 10*3/uL (ref 150–400)
RBC: 3.75 MIL/uL — ABNORMAL LOW (ref 3.87–5.11)
RDW: 13.2 % (ref 11.5–15.5)
WBC: 8.2 10*3/uL (ref 4.0–10.5)
nRBC: 0 % (ref 0.0–0.2)

## 2022-12-11 LAB — URINALYSIS, ROUTINE W REFLEX MICROSCOPIC
Bilirubin Urine: NEGATIVE
Glucose, UA: NEGATIVE mg/dL
Hgb urine dipstick: NEGATIVE
Ketones, ur: NEGATIVE mg/dL
Leukocytes,Ua: NEGATIVE
Nitrite: NEGATIVE
Protein, ur: NEGATIVE mg/dL
Specific Gravity, Urine: 1.018 (ref 1.005–1.030)
pH: 5 (ref 5.0–8.0)

## 2022-12-11 LAB — PROTEIN / CREATININE RATIO, URINE
Creatinine, Urine: 172 mg/dL
Protein Creatinine Ratio: 0.08 mg/mg{Cre} (ref 0.00–0.15)
Total Protein, Urine: 14 mg/dL

## 2022-12-11 LAB — WET PREP, GENITAL
Clue Cells Wet Prep HPF POC: NONE SEEN
Sperm: NONE SEEN
Trich, Wet Prep: NONE SEEN
WBC, Wet Prep HPF POC: <10 (ref <10)

## 2022-12-11 MED ORDER — TERCONAZOLE 0.8 % VA CREA: 1.0000 | TOPICAL_CREAM | Freq: Every day | VAGINAL | 0 refills | Status: DC

## 2022-12-11 MED ORDER — ONDANSETRON 4 MG PO TBDP
8.0000 mg | ORAL_TABLET | Freq: Once | ORAL | Status: AC
  Administered 2022-12-11: 8 mg via ORAL
  Filled 2022-12-11: qty 2

## 2022-12-11 MED ORDER — NIFEDIPINE ER OSMOTIC RELEASE 30 MG PO TB24
30.0000 mg | ORAL_TABLET | Freq: Once | ORAL | Status: AC
  Administered 2022-12-11: 30 mg via ORAL
  Filled 2022-12-11: qty 1

## 2022-12-11 MED ORDER — NIFEDIPINE ER OSMOTIC RELEASE 30 MG PO TB24: 30.0000 mg | ORAL_TABLET | Freq: Every day | ORAL | 2 refills | Status: DC

## 2022-12-11 NOTE — MAU Note (Signed)
Pt says has been vomiting since yesterday . No one at home sick . Feels some UC's  [facility]- saw CNM- from [employer]

## 2022-12-11 NOTE — MAU Provider Note (Signed)
History     CSN: [id number]  Arrival date and time: 12/11/22 0509   None     No chief complaint on file.   Annette Sullivan is a 37 y.o. [phone] at [redacted]w[redacted]d who receives care at [facility].  She states her next appt is on Wednesday May 22nd. She presents today for vomiting and cramping.  She reports she has had 6-7 incidents of vomiting since 6 or 7 in the evening.  She states she last vomited around 0500 after she tried to drink milk.  She states reports lower abdominal cramping that started after the 3rd incident.   She reports it is intermittent in nature.  She rates the cramping a 5-6/10 and denies worsening or relieving factors.  She denies vaginal concerns and endorses fetal movement. Patient states that she feels she may be having an anxiety attack.   Review of [facility] prenatal records: Yes from 09/28/2022 Significant findings: History of Short Cervix, History of GHTN  OB History     Gravida  5   Para  3   Term  3   Preterm  0   AB  1   Living  3      SAB  0   IAB  1   Ectopic  0   Multiple  0   Live Births  3           Past Medical History:  Diagnosis Date   Anemia    Anxiety    Anxiety 10/21/2017   No meds  No meds   Gestational hypertension 04/18/2018   Headache(784.0)    Hypertension    No pertinent past medical history    Normal postpartum course 04/20/2018   Obesity (BMI 30-39.9)    Postpartum anemia 04/20/2018   Vaginal delivery 04/20/2018    Past Surgical History:  Procedure Laterality Date   APPENDECTOMY     INSERTION OF MESH N/A 12/18/2015   Procedure: INSERTION OF MESH;  Surgeon: Abigail Miyamoto, MD;  Location: [facility] OR;  Service: General;  Laterality: N/A;   UMBILICAL HERNIA REPAIR N/A 12/18/2015   Procedure: HERNIA REPAIR UMBILICAL ADULT WITH MESH ;  Surgeon: Abigail Miyamoto, MD;  Location: [facility] OR;  Service: General;  Laterality: N/A;    Family History  Problem Relation Age of Onset   Anesthesia problems Neg Hx    Asthma Neg Hx    Cancer  Neg Hx    Depression Neg Hx    Heart disease Neg Hx    Hypertension Neg Hx     Social History   Tobacco Use   Smoking status: Never   Smokeless tobacco: Never  Vaping Use   Vaping Use: Never used  Substance Use Topics   Alcohol use: No   Drug use: No    Allergies: No Known Allergies  Medications Prior to Admission  Medication Sig Dispense Refill Last Dose   aspirin EC 81 MG tablet Take 81 mg by mouth daily. Swallow whole.      cholecalciferol (VITAMIN D3) 25 MCG (1000 UNIT) tablet Take 4,000 Units by mouth daily.      metoprolol succinate (TOPROL-XL) 25 MG 24 hr tablet Take 1 tablet (25 mg total) by mouth daily. (Patient not taking: Reported on 10/12/2022) 90 tablet 0    Prenatal MV & Min w/FA-DHA (PRENATAL GUMMIES PO) Take by mouth.       Review of Systems  Gastrointestinal:  Positive for abdominal pain, nausea and vomiting. Negative for constipation and diarrhea.  Genitourinary:  Negative for difficulty urinating, dysuria, vaginal bleeding and vaginal discharge.  Neurological:  Positive for headaches. Negative for dizziness and light-headedness.   Physical Exam   Blood pressure 139/76, pulse 95, temperature 98.3 F (36.8 C), temperature source Oral, resp. rate 16, height 5\' 4"  (1.626 m), weight 102.1 kg.  Vitals:   12/11/22 0645 12/11/22 0650 12/11/22 0709 12/11/22 0737  BP:  (!) 155/83 (!) 148/82 (!) 156/81  Pulse: (!) 117 (!) 106 96 (!) 105  Resp:   17   Temp:      TempSrc:      SpO2: 100%     Weight:      Height:         Physical Exam Vitals reviewed.  Constitutional:      General: She is not in acute distress.    Appearance: Normal appearance.  HENT:     Head: Normocephalic and atraumatic.  Eyes:     Conjunctiva/sclera: Conjunctivae normal.  Cardiovascular:     Rate and Rhythm: Normal rate.  Pulmonary:     Effort: Pulmonary effort is normal.  Musculoskeletal:        General: Normal range of motion.     Cervical back: Normal range of motion.   Skin:    General: Skin is warm and dry.  Neurological:     Mental Status: She is alert and oriented to person, place, and time.  Psychiatric:        Mood and Affect: Mood normal.        Behavior: Behavior normal.     Fetal Assessment 150 bpm, Mod Var, -Decels, +10x10 Accels Toco: No ctx or irritability graphed MAU Course   Results for orders placed or performed during the hospital encounter of 12/11/22 (from the past 24 hour(s))  Urinalysis, Routine w reflex microscopic -Urine, Clean Catch     Status: None   Collection Time: 12/11/22  5:58 AM  Result Value Ref Range   Color, Urine YELLOW YELLOW   APPearance CLEAR CLEAR   Specific Gravity, Urine 1.018 1.005 - 1.030   pH 5.0 5.0 - 8.0   Glucose, UA NEGATIVE NEGATIVE mg/dL   Hgb urine dipstick NEGATIVE NEGATIVE   Bilirubin Urine NEGATIVE NEGATIVE   Ketones, ur NEGATIVE NEGATIVE mg/dL   Protein, ur NEGATIVE NEGATIVE mg/dL   Nitrite NEGATIVE NEGATIVE   Leukocytes,Ua NEGATIVE NEGATIVE  Wet prep, genital     Status: Abnormal   Collection Time: 12/11/22  6:38 AM  Result Value Ref Range   Yeast Wet Prep HPF POC PRESENT (A) NONE SEEN   Trich, Wet Prep NONE SEEN NONE SEEN   Clue Cells Wet Prep HPF POC NONE SEEN NONE SEEN   WBC, Wet Prep HPF POC <10 <10   Sperm NONE SEEN   CBC     Status: Abnormal   Collection Time: 12/11/22  7:27 AM  Result Value Ref Range   WBC 8.2 4.0 - 10.5 K/uL   RBC 3.75 (L) 3.87 - 5.11 MIL/uL   Hemoglobin 10.2 (L) 12.0 - 15.0 g/dL   HCT 16.1 (L) 09.6 - 04.5 %   MCV 84.0 80.0 - 100.0 fL   MCH 27.2 26.0 - 34.0 pg   MCHC 32.4 30.0 - 36.0 g/dL   RDW 40.9 81.1 - 91.4 %   Platelets 257 150 - 400 K/uL   nRBC 0.0 0.0 - 0.2 %   No results found.  MDM PE Labs: UA, CBC, CMP, PC Ratio Measure BPQ15 min EFM Pain Management Assessment  and Plan  37 year old [id number]  SIUP at 27.4 weeks Cat I FT Nausea/Vomiting Abdominal Cramping  -POC Reviewed.  -Informed that UA without signs of significant  dehydration.  -Discussed cervical exam and patient desires STD testing. -Patient also requesting sleep aide, but drove herself her. -Discussed giving OTC medication list for obtaining medications herself.  -Exam performed and findings discussed.  [name] MSN, CNM 12/11/2022, 5:13 AM   Reassessment (7:13 AM) -Patient reports that she is feeling anxious. -Informed of yeast of wet prep. Rx for Terazol will be sent for treatment.  -Blood pressures elevated. Labs added.   Reassessment (7:55 AM) -Dr. Austin Miles consulted and informed of patient status, evaluation, interventions, and results. Advised: *Start antihypertensive medications. -Will give procardia 30xl now. -Patient informed of recommendations and agreeable. -Rx sent to pharmacy on file.  -Precautions reviewed. -List given for OTC treatment for sleep.  -Instructed to keep next appt for Wednesday.  -Encouraged to call primary office or return to MAU if symptoms worsen or with the onset of new symptoms. -Discharged to home in stable condition.  [name] MSN, CNM Advanced Practice Provider, [facility]

## 2022-12-13 LAB — GC/CHLAMYDIA PROBE AMP (~~LOC~~) NOT AT ARMC
Chlamydia: NEGATIVE
Comment: NEGATIVE
Comment: NORMAL
Neisseria Gonorrhea: NEGATIVE

## 2022-12-14 ENCOUNTER — Other Ambulatory Visit: Payer: Self-pay | Admitting: *Deleted

## 2022-12-14 ENCOUNTER — Ambulatory Visit: Payer: 59 | Attending: Maternal & Fetal Medicine

## 2022-12-14 ENCOUNTER — Ambulatory Visit: Payer: 59 | Admitting: *Deleted

## 2022-12-14 VITALS — BP 128/72 | HR 88

## 2022-12-14 DIAGNOSIS — O26873 Cervical shortening, third trimester: Secondary | ICD-10-CM | POA: Insufficient documentation

## 2022-12-14 DIAGNOSIS — O09523 Supervision of elderly multigravida, third trimester: Secondary | ICD-10-CM

## 2022-12-14 DIAGNOSIS — O99213 Obesity complicating pregnancy, third trimester: Secondary | ICD-10-CM

## 2022-12-14 DIAGNOSIS — Z3A28 28 weeks gestation of pregnancy: Secondary | ICD-10-CM

## 2022-12-14 DIAGNOSIS — O09293 Supervision of pregnancy with other poor reproductive or obstetric history, third trimester: Secondary | ICD-10-CM | POA: Insufficient documentation

## 2022-12-14 DIAGNOSIS — O10013 Pre-existing essential hypertension complicating pregnancy, third trimester: Secondary | ICD-10-CM | POA: Diagnosis not present

## 2022-12-14 DIAGNOSIS — O10919 Unspecified pre-existing hypertension complicating pregnancy, unspecified trimester: Secondary | ICD-10-CM | POA: Diagnosis present

## 2022-12-14 DIAGNOSIS — O09299 Supervision of pregnancy with other poor reproductive or obstetric history, unspecified trimester: Secondary | ICD-10-CM

## 2022-12-14 DIAGNOSIS — E669 Obesity, unspecified: Secondary | ICD-10-CM

## 2022-12-14 DIAGNOSIS — O10913 Unspecified pre-existing hypertension complicating pregnancy, third trimester: Secondary | ICD-10-CM

## 2022-12-16 ENCOUNTER — Ambulatory Visit: Payer: 59 | Admitting: Family Medicine

## 2022-12-22 DIAGNOSIS — O99019 Anemia complicating pregnancy, unspecified trimester: Secondary | ICD-10-CM | POA: Insufficient documentation

## 2023-01-12 ENCOUNTER — Ambulatory Visit: Payer: 59 | Admitting: *Deleted

## 2023-01-12 ENCOUNTER — Ambulatory Visit: Payer: 59 | Attending: Obstetrics

## 2023-01-12 VITALS — BP 131/71 | HR 88

## 2023-01-12 DIAGNOSIS — O99213 Obesity complicating pregnancy, third trimester: Secondary | ICD-10-CM | POA: Diagnosis not present

## 2023-01-12 DIAGNOSIS — O133 Gestational [pregnancy-induced] hypertension without significant proteinuria, third trimester: Secondary | ICD-10-CM | POA: Diagnosis present

## 2023-01-12 DIAGNOSIS — O09299 Supervision of pregnancy with other poor reproductive or obstetric history, unspecified trimester: Secondary | ICD-10-CM | POA: Insufficient documentation

## 2023-01-12 DIAGNOSIS — O10913 Unspecified pre-existing hypertension complicating pregnancy, third trimester: Secondary | ICD-10-CM | POA: Insufficient documentation

## 2023-01-12 DIAGNOSIS — Z3A32 32 weeks gestation of pregnancy: Secondary | ICD-10-CM

## 2023-01-12 DIAGNOSIS — Z8759 Personal history of other complications of pregnancy, childbirth and the puerperium: Secondary | ICD-10-CM | POA: Diagnosis present

## 2023-01-12 DIAGNOSIS — O09293 Supervision of pregnancy with other poor reproductive or obstetric history, third trimester: Secondary | ICD-10-CM

## 2023-01-12 DIAGNOSIS — O09523 Supervision of elderly multigravida, third trimester: Secondary | ICD-10-CM | POA: Diagnosis not present

## 2023-01-12 DIAGNOSIS — E669 Obesity, unspecified: Secondary | ICD-10-CM

## 2023-01-18 ENCOUNTER — Ambulatory Visit: Payer: 59

## 2023-01-19 ENCOUNTER — Ambulatory Visit: Payer: 59 | Admitting: *Deleted

## 2023-01-19 VITALS — BP 142/78 | HR 84

## 2023-01-19 DIAGNOSIS — O133 Gestational [pregnancy-induced] hypertension without significant proteinuria, third trimester: Secondary | ICD-10-CM

## 2023-01-19 DIAGNOSIS — O1414 Severe pre-eclampsia complicating childbirth: Secondary | ICD-10-CM | POA: Diagnosis not present

## 2023-01-19 DIAGNOSIS — O09523 Supervision of elderly multigravida, third trimester: Secondary | ICD-10-CM | POA: Insufficient documentation

## 2023-01-19 DIAGNOSIS — Z3A33 33 weeks gestation of pregnancy: Secondary | ICD-10-CM | POA: Insufficient documentation

## 2023-01-19 DIAGNOSIS — O1413 Severe pre-eclampsia, third trimester: Secondary | ICD-10-CM | POA: Diagnosis not present

## 2023-01-19 NOTE — Procedures (Addendum)
Annette Sullivan 09-18-1985 [redacted]w[redacted]d  Fetus A Non-Stress Test Interpretation for 01/19/23  Indication: Advanced Maternal Age >40 years and gest. HTN  Fetal Heart Rate A Mode: External Baseline Rate (A): 145 bpm Variability: Moderate Accelerations: 15 x 15 Multiple birth?: No  Uterine Activity Mode: Toco Contraction Frequency (min): none Resting Tone Palpated: Relaxed  Interpretation (Fetal Testing) Nonstress Test Interpretation: Reactive Overall Impression: Reassuring for gestational age Comments: Tracing reviewed by Dr. Judeth Cornfield

## 2023-01-19 NOTE — Procedures (Signed)
Annette Sullivan 01-18-1986 [redacted]w[redacted]d  Fetus A Non-Stress Test Interpretation for 01/19/23- [facility] ONLY  Indication: Advanced Maternal Age >40 years and GHTN  Fetal Heart Rate A Mode: External Baseline Rate (A): 145 bpm Variability: Moderate Accelerations: 15 x 15 Multiple birth?: No  Uterine Activity Mode: Toco Contraction Frequency (min): none Resting Tone Palpated: Relaxed  Interpretation (Fetal Testing) Nonstress Test Interpretation: Reactive Overall Impression: Reassuring for gestational age Comments: Tracing reviewed by Dr. Judeth Cornfield

## 2023-01-21 ENCOUNTER — Encounter: Payer: Self-pay | Admitting: *Deleted

## 2023-01-21 ENCOUNTER — Inpatient Hospital Stay (HOSPITAL_COMMUNITY): Payer: 59

## 2023-01-21 ENCOUNTER — Inpatient Hospital Stay (HOSPITAL_COMMUNITY)
Admission: AD | Admit: 2023-01-21 | Discharge: 2023-01-28 | DRG: 784 | Disposition: A | Discharge status: Home / Self Care | Payer: 59 | Attending: Obstetrics and Gynecology | Admitting: Obstetrics and Gynecology

## 2023-01-21 ENCOUNTER — Other Ambulatory Visit: Payer: Self-pay

## 2023-01-21 DIAGNOSIS — O1413 Severe pre-eclampsia, third trimester: Secondary | ICD-10-CM | POA: Diagnosis not present

## 2023-01-21 DIAGNOSIS — Z3A33 33 weeks gestation of pregnancy: Secondary | ICD-10-CM

## 2023-01-21 DIAGNOSIS — O99824 Streptococcus B carrier state complicating childbirth: Secondary | ICD-10-CM | POA: Diagnosis present

## 2023-01-21 DIAGNOSIS — Z7982 Long term (current) use of aspirin: Secondary | ICD-10-CM | POA: Diagnosis not present

## 2023-01-21 DIAGNOSIS — O99214 Obesity complicating childbirth: Secondary | ICD-10-CM | POA: Diagnosis present

## 2023-01-21 DIAGNOSIS — O1414 Severe pre-eclampsia complicating childbirth: Secondary | ICD-10-CM | POA: Diagnosis present

## 2023-01-21 DIAGNOSIS — H53413 Scotoma involving central area, bilateral: Secondary | ICD-10-CM

## 2023-01-21 DIAGNOSIS — D62 Acute posthemorrhagic anemia: Secondary | ICD-10-CM | POA: Diagnosis not present

## 2023-01-21 DIAGNOSIS — O322XX0 Maternal care for transverse and oblique lie, not applicable or unspecified: Secondary | ICD-10-CM | POA: Diagnosis present

## 2023-01-21 DIAGNOSIS — O09523 Supervision of elderly multigravida, third trimester: Secondary | ICD-10-CM | POA: Diagnosis not present

## 2023-01-21 DIAGNOSIS — Z302 Encounter for sterilization: Secondary | ICD-10-CM

## 2023-01-21 DIAGNOSIS — Z98891 History of uterine scar from previous surgery: Secondary | ICD-10-CM

## 2023-01-21 DIAGNOSIS — O139 Gestational [pregnancy-induced] hypertension without significant proteinuria, unspecified trimester: Secondary | ICD-10-CM | POA: Diagnosis present

## 2023-01-21 DIAGNOSIS — O9081 Anemia of the puerperium: Secondary | ICD-10-CM | POA: Diagnosis not present

## 2023-01-21 DIAGNOSIS — Z9851 Tubal ligation status: Secondary | ICD-10-CM

## 2023-01-21 DIAGNOSIS — O133 Gestational [pregnancy-induced] hypertension without significant proteinuria, third trimester: Secondary | ICD-10-CM | POA: Diagnosis not present

## 2023-01-21 DIAGNOSIS — R519 Headache, unspecified: Secondary | ICD-10-CM

## 2023-01-21 DIAGNOSIS — O99820 Streptococcus B carrier state complicating pregnancy: Secondary | ICD-10-CM | POA: Diagnosis not present

## 2023-01-21 DIAGNOSIS — O1410 Severe pre-eclampsia, unspecified trimester: Secondary | ICD-10-CM | POA: Diagnosis present

## 2023-01-21 LAB — CBC
HCT: 34.7 % — ABNORMAL LOW (ref 36.0–46.0)
Hemoglobin: 11.1 g/dL — ABNORMAL LOW (ref 12.0–15.0)
MCH: 27.4 pg (ref 26.0–34.0)
MCHC: 32 g/dL (ref 30.0–36.0)
MCV: 85.7 fL (ref 80.0–100.0)
Platelets: 251 10*3/uL (ref 150–400)
RBC: 4.05 MIL/uL (ref 3.87–5.11)
RDW: 14.6 % (ref 11.5–15.5)
WBC: 7.7 10*3/uL (ref 4.0–10.5)
nRBC: 0 % (ref 0.0–0.2)

## 2023-01-21 LAB — COMPREHENSIVE METABOLIC PANEL
ALT: 16 U/L (ref 0–44)
AST: 17 U/L (ref 15–41)
Albumin: 2.7 g/dL — ABNORMAL LOW (ref 3.5–5.0)
Alkaline Phosphatase: 70 U/L (ref 38–126)
Anion gap: 11 (ref 5–15)
BUN: 5 mg/dL — ABNORMAL LOW (ref 6–20)
CO2: 21 mmol/L — ABNORMAL LOW (ref 22–32)
Calcium: 9.1 mg/dL (ref 8.9–10.3)
Chloride: 103 mmol/L (ref 98–111)
Creatinine, Ser: 0.64 mg/dL (ref 0.44–1.00)
GFR, Estimated: >60 mL/min (ref >60)
Glucose, Bld: 107 mg/dL — ABNORMAL HIGH (ref 70–99)
Potassium: 3.6 mmol/L (ref 3.5–5.1)
Sodium: 135 mmol/L (ref 135–145)
Total Bilirubin: 0.6 mg/dL (ref 0.3–1.2)
Total Protein: 6.3 g/dL — ABNORMAL LOW (ref 6.5–8.1)

## 2023-01-21 LAB — URINALYSIS, ROUTINE W REFLEX MICROSCOPIC
Bilirubin Urine: NEGATIVE
Glucose, UA: NEGATIVE mg/dL
Hgb urine dipstick: NEGATIVE
Ketones, ur: NEGATIVE mg/dL
Leukocytes,Ua: NEGATIVE
Nitrite: NEGATIVE
Protein, ur: NEGATIVE mg/dL
Specific Gravity, Urine: 1.021 (ref 1.005–1.030)
pH: 5 (ref 5.0–8.0)

## 2023-01-21 LAB — TYPE AND SCREEN
ABO/RH(D): A POS
Antibody Screen: NEGATIVE

## 2023-01-21 LAB — PROTEIN / CREATININE RATIO, URINE
Creatinine, Urine: 176 mg/dL
Protein Creatinine Ratio: 0.09 mg/mg{Cre} (ref 0.00–0.15)
Total Protein, Urine: 16 mg/dL

## 2023-01-21 MED ORDER — ZOLPIDEM TARTRATE 5 MG PO TABS: 5.0000 mg | ORAL_TABLET | Freq: Every evening | ORAL | Status: DC | PRN

## 2023-01-21 MED ORDER — LABETALOL HCL 5 MG/ML IV SOLN
20.0000 mg | INTRAVENOUS | Status: DC | PRN
  Administered 2023-01-23: 20 mg via INTRAVENOUS
  Filled 2023-01-21: qty 4

## 2023-01-21 MED ORDER — LACTATED RINGERS IV SOLN: INTRAVENOUS | Status: DC

## 2023-01-21 MED ORDER — LABETALOL HCL 5 MG/ML IV SOLN: 40.0000 mg | INTRAVENOUS | Status: DC | PRN

## 2023-01-21 MED ORDER — LACTATED RINGERS IV SOLN: 125.0000 mL/h | INTRAVENOUS | Status: DC

## 2023-01-21 MED ORDER — SODIUM CHLORIDE 0.9% FLUSH: 3.0000 mL | INTRAVENOUS | Status: DC | PRN

## 2023-01-21 MED ORDER — SODIUM CHLORIDE 0.9 % IV SOLN: 250.0000 mL | INTRAVENOUS | Status: DC | PRN

## 2023-01-21 MED ORDER — MAGNESIUM SULFATE BOLUS VIA INFUSION
4.0000 g | Freq: Once | INTRAVENOUS | Status: AC
  Administered 2023-01-21: 4 g via INTRAVENOUS
  Filled 2023-01-21: qty 1000

## 2023-01-21 MED ORDER — BETAMETHASONE SOD PHOS & ACET 6 (3-3) MG/ML IJ SUSP
12.0000 mg | Freq: Once | INTRAMUSCULAR | Status: AC
  Administered 2023-01-21: 12 mg via INTRAMUSCULAR
  Filled 2023-01-21: qty 5

## 2023-01-21 MED ORDER — LABETALOL HCL 5 MG/ML IV SOLN: 80.0000 mg | INTRAVENOUS | Status: DC | PRN

## 2023-01-21 MED ORDER — MAGNESIUM SULFATE 40 GM/1000ML IV SOLN
2.0000 g/h | INTRAVENOUS | Status: AC
  Administered 2023-01-21 – 2023-01-22 (×2): 2 g/h via INTRAVENOUS
  Filled 2023-01-21 (×3): qty 1000

## 2023-01-21 MED ORDER — ACETAMINOPHEN-CAFFEINE 500-65 MG PO TABS
2.0000 | ORAL_TABLET | Freq: Once | ORAL | Status: AC
  Administered 2023-01-21: 2 via ORAL
  Filled 2023-01-21: qty 2

## 2023-01-21 MED ORDER — BETAMETHASONE SOD PHOS & ACET 6 (3-3) MG/ML IJ SUSP: 12.0000 mg | INTRAMUSCULAR | Status: DC

## 2023-01-21 MED ORDER — CALCIUM CARBONATE ANTACID 500 MG PO CHEW: 2.0000 | CHEWABLE_TABLET | ORAL | Status: DC | PRN

## 2023-01-21 MED ORDER — DOCUSATE SODIUM 100 MG PO CAPS
100.0000 mg | ORAL_CAPSULE | Freq: Every day | ORAL | Status: DC
  Administered 2023-01-22: 100 mg via ORAL
  Filled 2023-01-21: qty 1

## 2023-01-21 MED ORDER — CYCLOBENZAPRINE HCL 5 MG PO TABS
10.0000 mg | ORAL_TABLET | Freq: Once | ORAL | Status: AC
  Administered 2023-01-21: 10 mg via ORAL
  Filled 2023-01-21: qty 2

## 2023-01-21 MED ORDER — PRENATAL MULTIVITAMIN CH: 1.0000 | ORAL_TABLET | Freq: Every day | ORAL | Status: DC

## 2023-01-21 MED ORDER — ACETAMINOPHEN 325 MG PO TABS: 650.0000 mg | ORAL_TABLET | ORAL | Status: DC | PRN

## 2023-01-21 MED ORDER — HYDRALAZINE HCL 20 MG/ML IJ SOLN: 10.0000 mg | INTRAMUSCULAR | Status: DC | PRN

## 2023-01-21 MED ORDER — SODIUM CHLORIDE 0.9% FLUSH
3.0000 mL | Freq: Two times a day (BID) | INTRAVENOUS | Status: DC
  Administered 2023-01-21: 3 mL via INTRAVENOUS

## 2023-01-21 NOTE — MAU Provider Note (Signed)
History     CSN: [id number]  Arrival date and time: 01/21/23 1522   Event Date/Time   First Provider Initiated Contact with Patient 01/21/23 1623      Chief Complaint  Patient presents with   Dizziness   Headache   Visual Floaters   Patient is 37 y.o. [id number] [redacted]w[redacted]d here with complaints of new onset HA, Visual changes and dizziness. Patient was diagnosed at 75 weeks with gHTN and was started on Procardia at that time. She reports sudden onset of HA yesterday. She tried tylenol yesterday but it did not help. She reports today she started having visual changes. Her HA was global and now is mostly on the left side. Her left eye is more sensitive to light and she is keeping it closed.  +FM, denies LOF, VB, contractions, vaginal discharge.  Dizziness Associated symptoms include headaches. Pertinent negatives include no abdominal pain, chest pain, chills, congestion, coughing, fever, nausea, rash, sore throat or vomiting.  Headache  Associated symptoms include dizziness. Pertinent negatives include no abdominal pain, back pain, coughing, eye pain, fever, nausea, sore throat or vomiting.    OB History     Gravida  5   Para  3   Term  3   Preterm  0   AB  1   Living  3      SAB  0   IAB  1   Ectopic  0   Multiple  0   Live Births  3           Past Medical History:  Diagnosis Date   Anemia    Anxiety    Anxiety 10/21/2017   No meds  No meds   Gestational hypertension 04/18/2018   Headache(784.0)    Hypertension    No pertinent past medical history    Normal postpartum course 04/20/2018   Obesity (BMI 30-39.9)    Postpartum anemia 04/20/2018   Umbilical hernia 05/13/2015   Repaired with mesh 11/2015   Vaginal delivery 04/20/2018    Past Surgical History:  Procedure Laterality Date   APPENDECTOMY     INSERTION OF MESH N/A 12/18/2015   Procedure: INSERTION OF MESH;  Surgeon: Abigail Miyamoto, MD;  Location: [facility] OR;  Service: General;  Laterality: N/A;    UMBILICAL HERNIA REPAIR N/A 12/18/2015   Procedure: HERNIA REPAIR UMBILICAL ADULT WITH MESH ;  Surgeon: Abigail Miyamoto, MD;  Location: [facility] OR;  Service: General;  Laterality: N/A;    Family History  Problem Relation Age of Onset   Anesthesia problems Neg Hx    Asthma Neg Hx    Cancer Neg Hx    Depression Neg Hx    Heart disease Neg Hx    Hypertension Neg Hx     Social History   Tobacco Use   Smoking status: Never   Smokeless tobacco: Never  Vaping Use   Vaping Use: Never used  Substance Use Topics   Alcohol use: No   Drug use: No    Allergies: No Known Allergies  Medications Prior to Admission  Medication Sig Dispense Refill Last Dose   aspirin EC 81 MG tablet Take 81 mg by mouth daily. Swallow whole.      cholecalciferol (VITAMIN D3) 25 MCG (1000 UNIT) tablet Take 4,000 Units by mouth daily.      NIFEdipine (PROCARDIA XL) 30 MG 24 hr tablet Take 1 tablet (30 mg total) by mouth daily. 60 tablet 2    Prenatal MV & Min w/FA-DHA (PRENATAL GUMMIES  PO) Take by mouth.       Review of Systems  Constitutional:  Negative for chills and fever.  HENT:  Negative for congestion and sore throat.   Eyes:  Negative for pain and visual disturbance.  Respiratory:  Negative for cough, chest tightness and shortness of breath.   Cardiovascular:  Negative for chest pain.  Gastrointestinal:  Negative for abdominal pain, diarrhea, nausea and vomiting.  Endocrine: Negative for cold intolerance and heat intolerance.  Genitourinary:  Negative for dysuria and flank pain.  Musculoskeletal:  Negative for back pain.  Skin:  Negative for rash.  Allergic/Immunologic: Negative for food allergies.  Neurological:  Positive for dizziness and headaches. Negative for light-headedness.  Psychiatric/Behavioral:  Negative for agitation.    Physical Exam   Blood pressure (!) 146/78, pulse 89, temperature 98.5 F (36.9 C), temperature source Oral, resp. rate 16, height 5\' 4"  (1.626 m), weight 105.8 kg,  last menstrual period 06/01/2022, SpO2 99 %.  Patient Vitals for the past 24 hrs:  BP Temp Temp src Pulse Resp SpO2 Height Weight  01/21/23 1800 (!) 146/78 -- -- 89 -- 99 % -- --  01/21/23 1745 (!) 145/84 -- -- 89 -- 99 % -- --  01/21/23 1730 (!) 147/84 -- -- 94 -- 98 % -- --  01/21/23 1715 (!) 144/83 -- -- 93 -- 98 % -- --  01/21/23 1700 (!) 148/83 -- -- 92 -- 99 % -- --  01/21/23 1645 (!) 140/80 -- -- 95 -- 98 % -- --  01/21/23 1630 (!) 159/88 -- -- 97 -- 98 % -- --  01/21/23 1615 (!) 141/84 -- -- (!) 103 -- 98 % -- --  01/21/23 1600 (!) 144/78 -- -- (!) 107 -- 98 % -- --  01/21/23 1558 (!) 151/83 -- -- (!) 101 -- -- -- --  01/21/23 1540 (!) 142/84 98.5 F (36.9 C) Oral 99 16 99 % 5\' 4"  (1.626 m) 105.8 kg    Physical Exam Vitals and nursing note reviewed.  Constitutional:      General: She is not in acute distress.    Appearance: She is well-developed.     Comments: Pregnant female  HENT:     Head: Normocephalic and atraumatic.  Eyes:     General: No scleral icterus.    Conjunctiva/sclera: Conjunctivae normal.  Cardiovascular:     Rate and Rhythm: Normal rate.  Pulmonary:     Effort: Pulmonary effort is normal.  Chest:     Chest wall: No tenderness.  Abdominal:     Palpations: Abdomen is soft.     Tenderness: There is no abdominal tenderness. There is no guarding or rebound.     Comments: Gravid  Genitourinary:    Vagina: Normal.  Musculoskeletal:        General: Normal range of motion.     Cervical back: Normal range of motion and neck supple.  Skin:    General: Skin is warm and dry.     Findings: No rash.  Neurological:     Mental Status: She is alert and oriented to person, place, and time.     GCS: GCS eye subscore is 4. GCS verbal subscore is 5. GCS motor subscore is 6.     Cranial Nerves: Cranial nerves 2-12 are intact.     Sensory: Sensation is intact.     Motor: Motor function is intact.     Coordination: Coordination is intact.     Gait: Gait is  intact.  Deep Tendon Reflexes: Reflexes are normal and symmetric.     MAU Course  Procedures  I reviewed the patient's fetal monitoring.  Baseline HR: 130 Variability:  moderate Accels:present Decels: none  A/P: Reactive NST  Reassured regarding fetal status.   MDM: high  This patient presents to the ED for concern of   Chief Complaint  Patient presents with   Dizziness   Headache   Visual Floaters     These complains involves an extensive number of treatment options, and is a complaint that carries with it a high risk of complications and morbidity.  The differential diagnosis for  1. Elevated blood pressure in pregnancy INCLUDES high risk conditions like preeclampsia and preeclampsia with severe features, could also be transient spurious elevated blood pressure. Evaluation and monitoring necessary to determine. Patient is currently 33 weeks. Concern given progression for gHTN @27  weeks to now severe feature with HA that is not responding to treatment.   Co morbidities that complicate the patient evaluation: Patient Active Problem List   Diagnosis Date Noted   Anemia of pregnancy 12/22/2022   Gestational hypertension w/o significant proteinuria in 3rd trimester 12/11/2022   Request for sterilization 01/23/2018   Supervision of other high risk pregnancies, third trimester 10/21/2017   History of gestational hypertension 10/21/2017     External records from outside source obtained and reviewed including Scanned media records  Lab Tests: CMP, CBC, urine protein creatinine ratio  I ordered, and personally interpreted labs.   Results for orders placed or performed during the hospital encounter of 01/21/23 (from the past 24 hour(s))  Urinalysis, Routine w reflex microscopic -Urine, Clean Catch     Status: Abnormal   Collection Time: 01/21/23  3:33 PM  Result Value Ref Range   Color, Urine YELLOW YELLOW   APPearance HAZY (A) CLEAR   Specific Gravity, Urine 1.021 1.005 -  1.030   pH 5.0 5.0 - 8.0   Glucose, UA NEGATIVE NEGATIVE mg/dL   Hgb urine dipstick NEGATIVE NEGATIVE   Bilirubin Urine NEGATIVE NEGATIVE   Ketones, ur NEGATIVE NEGATIVE mg/dL   Protein, ur NEGATIVE NEGATIVE mg/dL   Nitrite NEGATIVE NEGATIVE   Leukocytes,Ua NEGATIVE NEGATIVE  Protein / creatinine ratio, urine     Status: None   Collection Time: 01/21/23  3:56 PM  Result Value Ref Range   Creatinine, Urine 176 mg/dL   Total Protein, Urine 16 mg/dL   Protein Creatinine Ratio 0.09 0.00 - 0.15 mg/mg[Cre]  CBC     Status: Abnormal   Collection Time: 01/21/23  4:34 PM  Result Value Ref Range   WBC 7.7 4.0 - 10.5 K/uL   RBC 4.05 3.87 - 5.11 MIL/uL   Hemoglobin 11.1 (L) 12.0 - 15.0 g/dL   HCT 16.1 (L) 09.6 - 04.5 %   MCV 85.7 80.0 - 100.0 fL   MCH 27.4 26.0 - 34.0 pg   MCHC 32.0 30.0 - 36.0 g/dL   RDW 40.9 81.1 - 91.4 %   Platelets 251 150 - 400 K/uL   nRBC 0.0 0.0 - 0.2 %  Comprehensive metabolic panel     Status: Abnormal   Collection Time: 01/21/23  4:34 PM  Result Value Ref Range   Sodium 135 135 - 145 mmol/L   Potassium 3.6 3.5 - 5.1 mmol/L   Chloride 103 98 - 111 mmol/L   CO2 21 (L) 22 - 32 mmol/L   Glucose, Bld 107 (H) 70 - 99 mg/dL   BUN 5 (L) 6 - 20 mg/dL  Creatinine, Ser 0.64 0.44 - 1.00 mg/dL   Calcium 9.1 8.9 - 16.1 mg/dL   Total Protein 6.3 (L) 6.5 - 8.1 g/dL   Albumin 2.7 (L) 3.5 - 5.0 g/dL   AST 17 15 - 41 U/L   ALT 16 0 - 44 U/L   Alkaline Phosphatase 70 38 - 126 U/L   Total Bilirubin 0.6 0.3 - 1.2 mg/dL   GFR, Estimated >09 >60 mL/min   Anion gap 11 5 - 15   Imaging Studies ordered:  I ordered imaging studies includingCT Head I independently visualized and interpreted imaging which showed No acute process, ruled out PRES and bleeding I agree with the radiologist interpretation  Medicines ordered and prescription drug management: Medications: Flexeril, Excedrin Tension, Magnesium, started in Antepartum, Betamethasone   MAU Course: 7:10 PM discussed  admission with Dr Osborn Coho who agrees   After the interventions noted above, I reevaluated the patient and found that they have :unchanged  Dispostion: admitted to the hospital   Assessment and Plan   1. Preeclampsia, severe, third trimester   2. Acute intractable headache, unspecified headache type   3. Visual field scotoma of both eyes   4. [redacted] weeks gestation of pregnancy    - Attempted to treat HA with Excedrin Tension and Flexeril, did not resolve HA-- assume this is a a severe feature of preeclampsia - BMZ x1 given in MAU - Admit for magnesium therapy  Federico Flake 01/21/2023, 7:10 PM

## 2023-01-21 NOTE — MAU Note (Signed)
.  Annette Sullivan is a 37 y.o. at [redacted]w[redacted]d here in MAU reporting: HA since yesterday as well as visual floaters and dizziness since this morning that are occasional. She reports the floaters and dizziness occur at the same time. She reports this morning while she was in the shower she was cleaning herself with her hand and "felt something watery on my vagina." Has not felt any leaks or gushes. Denies VB or LOF. +FM.   Takes Iron, PNV, baby ASA, vit D, and procardia XL. Took her procardia this morning. GHTN.  Onset of complaint: Yesterday Pain score: 7/10 HA - right sided  FHT: 145 initial external Lab orders placed from triage: UA

## 2023-01-21 NOTE — MAU Note (Signed)
Transport arrived to take patient to CT. 

## 2023-01-22 LAB — COMPREHENSIVE METABOLIC PANEL
ALT: 16 U/L (ref 0–44)
AST: 14 U/L — ABNORMAL LOW (ref 15–41)
Albumin: 2.7 g/dL — ABNORMAL LOW (ref 3.5–5.0)
Alkaline Phosphatase: 68 U/L (ref 38–126)
Anion gap: 13 (ref 5–15)
BUN: 5 mg/dL — ABNORMAL LOW (ref 6–20)
CO2: 21 mmol/L — ABNORMAL LOW (ref 22–32)
Calcium: 7.7 mg/dL — ABNORMAL LOW (ref 8.9–10.3)
Chloride: 101 mmol/L (ref 98–111)
Creatinine, Ser: 0.68 mg/dL (ref 0.44–1.00)
GFR, Estimated: >60 mL/min (ref >60)
Glucose, Bld: 122 mg/dL — ABNORMAL HIGH (ref 70–99)
Potassium: 3.9 mmol/L (ref 3.5–5.1)
Sodium: 135 mmol/L (ref 135–145)
Total Bilirubin: 0.6 mg/dL (ref 0.3–1.2)
Total Protein: 6.1 g/dL — ABNORMAL LOW (ref 6.5–8.1)

## 2023-01-22 LAB — CBC
HCT: 32.7 % — ABNORMAL LOW (ref 36.0–46.0)
Hemoglobin: 10.5 g/dL — ABNORMAL LOW (ref 12.0–15.0)
MCH: 27.6 pg (ref 26.0–34.0)
MCHC: 32.1 g/dL (ref 30.0–36.0)
MCV: 86.1 fL (ref 80.0–100.0)
Platelets: 220 10*3/uL (ref 150–400)
RBC: 3.8 MIL/uL — ABNORMAL LOW (ref 3.87–5.11)
RDW: 14.6 % (ref 11.5–15.5)
WBC: 8.1 10*3/uL (ref 4.0–10.5)
nRBC: 0 % (ref 0.0–0.2)

## 2023-01-22 LAB — LACTATE DEHYDROGENASE: LDH: 116 U/L (ref 98–192)

## 2023-01-22 LAB — URIC ACID: Uric Acid, Serum: 5.1 mg/dL (ref 2.5–7.1)

## 2023-01-22 LAB — MAGNESIUM: Magnesium: 4.5 mg/dL — ABNORMAL HIGH (ref 1.7–2.4)

## 2023-01-22 MED ORDER — LACTATED RINGERS IV SOLN: INTRAVENOUS | Status: DC

## 2023-01-22 MED ORDER — MISOPROSTOL 25 MCG QUARTER TABLET
25.0000 ug | ORAL_TABLET | ORAL | Status: AC | PRN
  Administered 2023-01-22 (×3): 25 ug via VAGINAL
  Filled 2023-01-22 (×3): qty 1

## 2023-01-22 MED ORDER — LACTATED RINGERS IV SOLN: 500.0000 mL | INTRAVENOUS | Status: DC | PRN

## 2023-01-22 MED ORDER — PENICILLIN G POT IN DEXTROSE 60000 UNIT/ML IV SOLN
3.0000 10*6.[IU] | INTRAVENOUS | Status: DC
  Administered 2023-01-22 – 2023-01-24 (×8): 3 10*6.[IU] via INTRAVENOUS
  Filled 2023-01-22 (×8): qty 50

## 2023-01-22 MED ORDER — FENTANYL CITRATE (PF) 100 MCG/2ML IJ SOLN: 50.0000 ug | INTRAMUSCULAR | Status: DC | PRN

## 2023-01-22 MED ORDER — OXYTOCIN BOLUS FROM INFUSION: 333.0000 mL | Freq: Once | INTRAVENOUS | Status: DC

## 2023-01-22 MED ORDER — OXYCODONE-ACETAMINOPHEN 5-325 MG PO TABS: 2.0000 | ORAL_TABLET | ORAL | Status: DC | PRN

## 2023-01-22 MED ORDER — LIDOCAINE HCL (PF) 1 % IJ SOLN: 30.0000 mL | INTRAMUSCULAR | Status: DC | PRN

## 2023-01-22 MED ORDER — TERBUTALINE SULFATE 1 MG/ML IJ SOLN
0.2500 mg | Freq: Once | INTRAMUSCULAR | Status: AC | PRN
  Administered 2023-01-24: 0.25 mg via SUBCUTANEOUS
  Filled 2023-01-22: qty 1

## 2023-01-22 MED ORDER — SODIUM CHLORIDE 0.9 % IV SOLN
5.0000 10*6.[IU] | Freq: Once | INTRAVENOUS | Status: AC
  Administered 2023-01-22: 5 10*6.[IU] via INTRAVENOUS
  Filled 2023-01-22: qty 5

## 2023-01-22 MED ORDER — ONDANSETRON HCL 4 MG/2ML IJ SOLN: 4.0000 mg | Freq: Four times a day (QID) | INTRAMUSCULAR | Status: DC | PRN

## 2023-01-22 MED ORDER — ACETAMINOPHEN 325 MG PO TABS: 650.0000 mg | ORAL_TABLET | ORAL | Status: DC | PRN

## 2023-01-22 MED ORDER — BETAMETHASONE SOD PHOS & ACET 6 (3-3) MG/ML IJ SUSP
12.0000 mg | Freq: Once | INTRAMUSCULAR | Status: AC
  Administered 2023-01-22: 12 mg via INTRAMUSCULAR

## 2023-01-22 MED ORDER — SOD CITRATE-CITRIC ACID 500-334 MG/5ML PO SOLN: 30.0000 mL | ORAL | Status: DC | PRN

## 2023-01-22 MED ORDER — OXYTOCIN-SODIUM CHLORIDE 30-0.9 UT/500ML-% IV SOLN
1.0000 m[IU]/min | INTRAVENOUS | Status: DC
  Administered 2023-01-23: 1 m[IU]/min via INTRAVENOUS
  Filled 2023-01-22: qty 500

## 2023-01-22 MED ORDER — OXYCODONE-ACETAMINOPHEN 5-325 MG PO TABS: 1.0000 | ORAL_TABLET | ORAL | Status: DC | PRN

## 2023-01-22 MED ORDER — OXYTOCIN-SODIUM CHLORIDE 30-0.9 UT/500ML-% IV SOLN: 2.5000 [IU]/h | INTRAVENOUS | Status: DC

## 2023-01-22 NOTE — Plan of Care (Signed)
  Problem: Education: Goal: Knowledge of disease or condition will improve Outcome: Progressing Goal: Knowledge of the prescribed therapeutic regimen will improve Outcome: Progressing Goal: Individualized Educational Video(s) Outcome: Progressing   Problem: Clinical Measurements: Goal: Complications related to the disease process, condition or treatment will be avoided or minimized Outcome: Progressing   Problem: Education: Goal: Knowledge of General Education information will improve Description: Including pain rating scale, medication(s)/side effects and non-pharmacologic comfort measures Outcome: Progressing   Problem: Health Behavior/Discharge Planning: Goal: Ability to manage health-related needs will improve Outcome: Progressing   Problem: Clinical Measurements: Goal: Ability to maintain clinical measurements within normal limits will improve Outcome: Progressing Goal: Will remain free from infection Outcome: Progressing Goal: Diagnostic test results will improve Outcome: Progressing Goal: Respiratory complications will improve Outcome: Progressing Goal: Cardiovascular complication will be avoided Outcome: Progressing   Problem: Activity: Goal: Risk for activity intolerance will decrease Outcome: Progressing   Problem: Nutrition: Goal: Adequate nutrition will be maintained Outcome: Progressing   Problem: Coping: Goal: Level of anxiety will decrease Outcome: Progressing   Problem: Elimination: Goal: Will not experience complications related to bowel motility Outcome: Progressing Goal: Will not experience complications related to urinary retention Outcome: Progressing   Problem: Pain Managment: Goal: General experience of comfort will improve Outcome: Progressing   Problem: Safety: Goal: Ability to remain free from injury will improve Outcome: Progressing   Problem: Skin Integrity: Goal: Risk for impaired skin integrity will decrease Outcome:  Progressing   Problem: Education: Goal: Knowledge of disease or condition will improve Outcome: Progressing Goal: Knowledge of the prescribed therapeutic regimen will improve Outcome: Progressing   Problem: Fluid Volume: Goal: Peripheral tissue perfusion will improve Outcome: Progressing   Problem: Clinical Measurements: Goal: Complications related to disease process, condition or treatment will be avoided or minimized Outcome: Progressing   Problem: Education: Goal: Knowledge of Childbirth will improve Outcome: Progressing Goal: Ability to make informed decisions regarding treatment and plan of care will improve Outcome: Progressing Goal: Ability to state and carry out methods to decrease the pain will improve Outcome: Progressing Goal: Individualized Educational Video(s) Outcome: Progressing   Problem: Coping: Goal: Ability to verbalize concerns and feelings about labor and delivery will improve Outcome: Progressing   Problem: Life Cycle: Goal: Ability to make normal progression through stages of labor will improve Outcome: Progressing Goal: Ability to effectively push during vaginal delivery will improve Outcome: Progressing   Problem: Role Relationship: Goal: Will demonstrate positive interactions with the child Outcome: Progressing   Problem: Safety: Goal: Risk of complications during labor and delivery will decrease Outcome: Progressing   Problem: Pain Management: Goal: Relief or control of pain from uterine contractions will improve Outcome: Progressing

## 2023-01-22 NOTE — Progress Notes (Signed)
Patient ID: Annette Sullivan, female   DOB: April 07, 1986, 37 y.o.   MRN: [id number]  Tracing reviewed remotely by me cat 1 and reactive with baseline 130 Ctxs q4-56min VE FT/T/-3 by RN at 1730 when second dose of cytotec was placed vaginally 3rd dose due at 2130 Plan pitocin thereafter Cont Mg Vitals and labs reviewed BPs elevated in mild range Cont PCN for GBS+ Pain medicine upon request Anticipate NSVD

## 2023-01-22 NOTE — Progress Notes (Addendum)
Patient ID: Annette Sullivan, female   DOB: [birth year], 37 y.o.   MRN: [id number]  Annette Sullivan is a 37 y.o. [id number] at [redacted]w[redacted]d admitted for Pre-Eclampsia with severe features  Hospital Day No: 2  Subjective: Still c/o a headache.  No visual disturbances since magnesium started.  Denies RUQ/Abdominal pain  Objective: BP 137/69 (BP Location: Right Arm)   Pulse 94   Temp 97.9 F (36.6 C) (Oral)   Resp 17   Ht 5\' 4"  (1.626 m)   Wt 105.8 kg   LMP 06/01/2022   SpO2 100%   BMI 40.03 kg/m  I/O last 3 completed shifts: In: 1994.5 [P.O.:960; I.V.:1034.5] Out: 2250 [Urine:2250] Total I/O In: 982.7 [P.O.:360; I.V.:622.7] Out: 600 [Urine:600]  Physical Exam:  Gen: alert and no distress Chest/Lungs: unlabored breathing  Heart/Pulse: RRR  Abdomen: soft, gravid, nontender Uterine fundus: soft, nontender Skin & Color: warm and dry  Neurological: AOx3, DTRs 1+ EXT: negative Homan's b/l, edema trace  FHT:  FHR: 130 bpm, variability: moderate,  accelerations:  Present,  decelerations:  Absent UC:   none SVE:    C/L/P  Bedside ultrasound cephalic presentation  MFM ultrasound 01/10/23 efw 4lbs 11oz 71%  Labs: Lab Results  Component Value Date   WBC 7.7 01/21/2023   HGB 11.1 (L) 01/21/2023   HCT 34.7 (L) 01/21/2023   MCV 85.7 01/21/2023   PLT 251 01/21/2023    Assessment and Plan: has Supervision of other high risk pregnancies, third trimester; Request for sterilization; History of gestational hypertension; Gestational hypertension w/o significant proteinuria in 3rd trimester; Anemia of pregnancy; Preeclampsia, severe; and Gestational hypertension on their problem list.  Dr. Grace Bushy called and after reviewing patient's chart recommend delivery d/t unrelenting HA.  She was on procardia xl 30mg  for [facility] prior to admission.   Will give 2nd dose of BMZ now Will cont Mg Consult to Neonatology Discussed POC with the patient and she is agreeable She desires sterilization and reports signing  tubal papers about a month ago and desires Tdap prior to discharge Cat 1 tracing Will transfer to L&D and start with cervical ripening. Pain medicine upon request  Purcell Nails 01/22/2023, 11:49 AM

## 2023-01-22 NOTE — Progress Notes (Signed)
Subjective:    Headache has improved, now rates headache as 2/10. Visual changes have resolved and sight is clear per pt. Denies RUQ pain. Discussed discontinuing mag sulfate after 24 hours and second steroid injection, MFM consult, and questions answered.   Objective:    VS: BP 117/62 (BP Location: Right Arm)   Pulse 96   Temp 98.2 F (36.8 C) (Oral)   Resp 16   Ht 5\' 4"  (1.626 m)   Wt 105.8 kg   LMP 06/01/2022   SpO2 95%   BMI 40.03 kg/m  FHR : baseline 125 / variability moderate / accelerations present / absent decelerations Toco: no contractions perceived by pt or palpable Membranes: intact    Assessment/Plan:   37 y.o. [id number] [redacted]w[redacted]d Pre-eclampsia    -mild range, no severe range BO    -HA improved    -visual changes resolved    -D/C magnesium after 24 hrs  Fetal Wellbeing:  Category I    -continuous EFM while on mag sulfate    -BMZ # 2 @ 1900  Plan of care developed with Dr. Evorn Gong DNP, CNM 01/22/2023 7:40 AM

## 2023-01-22 NOTE — H&P (Signed)
OB ADMISSION/ HISTORY & PHYSICAL:  Admission Date: 01/21/2023  3:22 PM  Admit Diagnosis: Pre-eclampsia  Annette Sullivan is a 37 y.o. female [phone] [redacted]w[redacted]d presenting for headache and visual changes. Endorses active FM, denies LOF and vaginal bleeding. GHTN was dx at 27 wks and pt has been treated with Procardia XL 30 mg daily. Today's new onset HA did not respond to usual OTC treatment. HA was also refractory to treatment in MAU. No acute intracranial findings seen on CT of the head. Pt to be admitted for seizure precautions and antenatal steroids. Pt desires permanen sterilization after the completion of this pregnancy.   History of current pregnancy: [id number]   Patient entered care with [facility] at 9+3 wks.   EDC 03/08/23 by LMP and congruent with [place] @ 9+2 wks.   Anatomy scan:  19+2 wks, complete w/ anterior placenta.   Antenatal testing: for AMA and GHTN started at 32 weeks Last evaluation: 32+1  wks cephalic/ anterior placenta/ AFI 14.86/ BPP 8/8/ EFW 4 lb 11 oz (71%)   Significant prenatal events:  Patient Active Problem List   Diagnosis Date Noted   Preeclampsia, severe 01/21/2023   Gestational hypertension 01/21/2023   Anemia of pregnancy 12/22/2022   Gestational hypertension w/o significant proteinuria in 3rd trimester 12/11/2022   Request for sterilization 01/23/2018   Supervision of other high risk pregnancies, third trimester 10/21/2017   History of gestational hypertension 10/21/2017    Prenatal Labs: ABO, Rh: --/--/A POS (06/28 1632) Antibody: NEG (06/28 1632) Rubella:   immune RPR:   NR HBsAg:   NR HIV:   NR GBS:   positive GC/CHL: neg/neg Genetics: low-risk, SMA carrier Vaccines: Tdap: desires vaccine during this hospitalization    OB History  Gravida Para Term Preterm AB Living  5 3 3  0 1 3  SAB IAB Ectopic Multiple Live Births  0 1 0 0 3    # Outcome Date GA Lbr Len/2nd Weight Sex Delivery Anes PTL Lv  5 Current           4 Term 04/19/18 [redacted]w[redacted]d 19:00 / 00:48 3300  g F Vag-Spont EPI  LIV  3 Term 08/24/12 [redacted]w[redacted]d 01:50 / 00:13 3487 g M Vag-Spont Local, EPI  LIV     Birth Comments: caput  2 Term 2011 [redacted]w[redacted]d  3345 g F Vag-Spont EPI  LIV  1 IAB 2010            Medical / Surgical History: Past medical history:  Past Medical History:  Diagnosis Date   Anemia    Anxiety    Anxiety 10/21/2017   No meds  No meds   Gestational hypertension 04/18/2018   Headache(784.0)    Hypertension    No pertinent past medical history    Normal postpartum course 04/20/2018   Obesity (BMI 30-39.9)    Postpartum anemia 04/20/2018   Umbilical hernia 05/13/2015   Repaired with mesh 11/2015   Vaginal delivery 04/20/2018    Past surgical history:  Past Surgical History:  Procedure Laterality Date   APPENDECTOMY     INSERTION OF MESH N/A 12/18/2015   Procedure: INSERTION OF MESH;  Surgeon: Abigail Miyamoto, MD;  Location: [facility];  Service: General;  Laterality: N/A;   UMBILICAL HERNIA REPAIR N/A 12/18/2015   Procedure: HERNIA REPAIR UMBILICAL ADULT WITH MESH ;  Surgeon: Abigail Miyamoto, MD;  Location: [facility] OR;  Service: General;  Laterality: N/A;   Family History:  Family History  Problem Relation Age of Onset   Anesthesia  problems Neg Hx    Asthma Neg Hx    Cancer Neg Hx    Depression Neg Hx    Heart disease Neg Hx    Hypertension Neg Hx     Social History:  reports that she has never smoked. She has never used smokeless tobacco. She reports that she does not drink alcohol and does not use drugs.  Allergies: Patient has no known allergies.   Current Medications at time of admission:  Prior to Admission medications   Medication Sig Start Date End Date Taking? Authorizing Provider  aspirin EC 81 MG tablet Take 81 mg by mouth daily. Swallow whole.    [provider]  cholecalciferol (VITAMIN D3) 25 MCG (1000 UNIT) tablet Take 4,000 Units by mouth daily.    [provider]  NIFEdipine (PROCARDIA XL) 30 MG 24 hr tablet Take 1 tablet (30 mg total)  by mouth daily. 12/11/22   Gerrit Heck, CNM  Prenatal MV & Min w/FA-DHA (PRENATAL GUMMIES PO) Take by mouth.    [provider]    Review of Systems: Constitutional: Negative   HENT: Negative   Eyes: Negative   Respiratory: Negative   Cardiovascular: Negative   Gastrointestinal: Negative  Genitourinary: neg for bloody show, neg for LOF   Musculoskeletal: Negative   Skin: Negative   Neurological: Negative   Endo/Heme/Allergies: Negative   Psychiatric/Behavioral: Negative    Physical Exam: VS: Blood pressure 117/62, pulse 96, temperature 98.2 F (36.8 C), temperature source Oral, resp. rate 16, height 5\' 4"  (1.626 m), weight 105.8 kg, last menstrual period 06/01/2022, SpO2 95 %. AAO x3, no signs of distress Cardiovascular: RRR Respiratory: Unlabored GU/GI: Abdomen gravid, non-tender, non-distended, active FM, vertex Extremities: 1+ edema, negative for pain, tenderness, and cords  Cervical exam: deferred, no signs of labor FHR: baseline rate 130 / variability moderate / accelerations present / absent decelerations TOCO: none   Prenatal Transfer Tool  Maternal Diabetes: No Genetic Screening: low-risk, SMA carrier Maternal Ultrasounds/Referrals: Normal Fetal Ultrasounds or other Referrals:  Referred to Materal Fetal Medicine  Maternal Substance Abuse:  No Significant Maternal Medications:  Meds include: Other: procardia Significant Maternal Lab Results: Group B Strep positive Number of Prenatal Visits:greater than 3 verified prenatal visits Other Comments:  None    Assessment: 38 y.o. [id number] [redacted]w[redacted]d Pre-eclampsia with severe features    -mild range BP    -HA    -scotoma    -protein creatinine ratio 0.09 FHR category 1 GBS positive  Plan:  Admit to [facility] specialty Magnesium sulfate 4 g load and 2 g/hr x24 hrs Antenatal steroids Reg diet Hypertensive protocol  Dr Su Hilt aware of admission and plan of care  Roma Schanz DNP, CNM 01/22/2023 7:46  AM

## 2023-01-23 ENCOUNTER — Inpatient Hospital Stay (HOSPITAL_COMMUNITY): Payer: 59 | Admitting: Anesthesiology

## 2023-01-23 DIAGNOSIS — O133 Gestational [pregnancy-induced] hypertension without significant proteinuria, third trimester: Secondary | ICD-10-CM | POA: Diagnosis not present

## 2023-01-23 DIAGNOSIS — Z3A33 33 weeks gestation of pregnancy: Secondary | ICD-10-CM | POA: Diagnosis not present

## 2023-01-23 LAB — CBC
HCT: 36.4 % (ref 36.0–46.0)
HCT: 34.8 % — ABNORMAL LOW (ref 36.0–46.0)
Hemoglobin: 11.5 g/dL — ABNORMAL LOW (ref 12.0–15.0)
Hemoglobin: 11 g/dL — ABNORMAL LOW (ref 12.0–15.0)
MCH: 27 pg (ref 26.0–34.0)
MCH: 26.9 pg (ref 26.0–34.0)
MCHC: 31.6 g/dL (ref 30.0–36.0)
MCHC: 31.6 g/dL (ref 30.0–36.0)
MCV: 85.4 fL (ref 80.0–100.0)
MCV: 85.1 fL (ref 80.0–100.0)
Platelets: 243 10*3/uL (ref 150–400)
Platelets: 233 10*3/uL (ref 150–400)
RBC: 4.26 MIL/uL (ref 3.87–5.11)
RBC: 4.09 MIL/uL (ref 3.87–5.11)
RDW: 14.6 % (ref 11.5–15.5)
RDW: 14.7 % (ref 11.5–15.5)
WBC: 9.8 10*3/uL (ref 4.0–10.5)
WBC: 11.1 10*3/uL — ABNORMAL HIGH (ref 4.0–10.5)
nRBC: 0 % (ref 0.0–0.2)
nRBC: 0 % (ref 0.0–0.2)

## 2023-01-23 LAB — COMPREHENSIVE METABOLIC PANEL
ALT: 18 U/L (ref 0–44)
AST: 48 U/L — ABNORMAL HIGH (ref 15–41)
Albumin: 3 g/dL — ABNORMAL LOW (ref 3.5–5.0)
Alkaline Phosphatase: 72 U/L (ref 38–126)
Anion gap: 11 (ref 5–15)
BUN: 6 mg/dL (ref 6–20)
CO2: 20 mmol/L — ABNORMAL LOW (ref 22–32)
Calcium: 7.5 mg/dL — ABNORMAL LOW (ref 8.9–10.3)
Chloride: 101 mmol/L (ref 98–111)
Creatinine, Ser: 0.59 mg/dL (ref 0.44–1.00)
GFR, Estimated: >60 mL/min (ref >60)
Glucose, Bld: 110 mg/dL — ABNORMAL HIGH (ref 70–99)
Potassium: 4.8 mmol/L (ref 3.5–5.1)
Sodium: 132 mmol/L — ABNORMAL LOW (ref 135–145)
Total Bilirubin: 1.1 mg/dL (ref 0.3–1.2)
Total Protein: 6.2 g/dL — ABNORMAL LOW (ref 6.5–8.1)

## 2023-01-23 LAB — MAGNESIUM: Magnesium: 4.9 mg/dL — ABNORMAL HIGH (ref 1.7–2.4)

## 2023-01-23 LAB — RPR: RPR Ser Ql: NONREACTIVE

## 2023-01-23 MED ORDER — LACTATED RINGERS IV SOLN: INTRAVENOUS | Status: DC

## 2023-01-23 MED ORDER — EPHEDRINE 5 MG/ML INJ: 10.0000 mg | INTRAVENOUS | Status: DC | PRN

## 2023-01-23 MED ORDER — MAGNESIUM SULFATE 40 GM/1000ML IV SOLN
2.0000 g/h | INTRAVENOUS | Status: AC
  Administered 2023-01-23 – 2023-01-24 (×2): 2 g/h via INTRAVENOUS
  Filled 2023-01-23: qty 1000

## 2023-01-23 MED ORDER — LIDOCAINE HCL (PF) 1 % IJ SOLN
INTRAMUSCULAR | Status: DC | PRN
  Administered 2023-01-23 (×2): 4 mL via EPIDURAL

## 2023-01-23 MED ORDER — MAGNESIUM SULFATE 40 GM/1000ML IV SOLN: 1.0000 g/h | INTRAVENOUS | Status: DC

## 2023-01-23 MED ORDER — FENTANYL-BUPIVACAINE-NACL 0.5-0.125-0.9 MG/250ML-% EP SOLN
12.0000 mL/h | EPIDURAL | Status: DC | PRN
  Administered 2023-01-23: 12 mL/h via EPIDURAL
  Filled 2023-01-23: qty 250

## 2023-01-23 MED ORDER — LACTATED RINGERS IV SOLN
500.0000 mL | Freq: Once | INTRAVENOUS | Status: AC
  Administered 2023-01-23: 500 mL via INTRAVENOUS

## 2023-01-23 MED ORDER — DIPHENHYDRAMINE HCL 50 MG/ML IJ SOLN: 12.5000 mg | INTRAMUSCULAR | Status: DC | PRN

## 2023-01-23 MED ORDER — PHENYLEPHRINE 80 MCG/ML (10ML) SYRINGE FOR IV PUSH (FOR BLOOD PRESSURE SUPPORT)
80.0000 ug | PREFILLED_SYRINGE | INTRAVENOUS | Status: DC | PRN
  Filled 2023-01-23: qty 10

## 2023-01-23 MED ORDER — PHENYLEPHRINE 80 MCG/ML (10ML) SYRINGE FOR IV PUSH (FOR BLOOD PRESSURE SUPPORT): 80.0000 ug | PREFILLED_SYRINGE | INTRAVENOUS | Status: DC | PRN

## 2023-01-23 NOTE — Progress Notes (Signed)
Phone note.with Dr.Roberts.  I discussed with Dr. Su Hilt ms. Cumber's care.  She is a 37 yo G5P3 with diagnosis of GHTN being treated with procardia. She subsequently has had a severe unrelenting headache. This morning her headache was reduced by still persistent per Dr. Su Hilt.  Her blood pressure remained in the 140/90's  Her labs are normal are normal without proteinuria.  CT scan was performed and noted to be normal.  I discussed with Dr. Su Hilt given the elevate blood pressure treated with procardia and unrelenting headache, I recommend moving toward delivery if her headache is resolved. If her headache resolves she can remain pregnant until 34 weeks.  All questions answered.  Novella Olive, MD.

## 2023-01-23 NOTE — Progress Notes (Signed)
Tracing reviewed remotely by me cat 1 Ctxs q2-55min

## 2023-01-23 NOTE — Progress Notes (Signed)
Patient ID: Annette Sullivan, female   DOB: Nov 15, 1985, 37 y.o.   MRN: [id number]  Tracing reviewed remotely by me cat 1

## 2023-01-23 NOTE — Anesthesia Procedure Notes (Signed)
Epidural Patient location during procedure: OB Start time: 01/23/2023 10:36 AM End time: 01/23/2023 10:39 AM  Staffing Anesthesiologist: Kaylyn Layer, MD Performed: anesthesiologist   Preanesthetic Checklist Completed: patient identified, IV checked, risks and benefits discussed, monitors and equipment checked, pre-op evaluation and timeout performed  Epidural Patient position: sitting Prep: DuraPrep and site prepped and draped Patient monitoring: continuous pulse ox, blood pressure and heart rate Approach: midline Location: L3-L4 Injection technique: LOR air  Needle:  Needle type: Tuohy  Needle gauge: 17 G Needle length: 9 cm Needle insertion depth: 7 cm Catheter type: closed end flexible Catheter size: 19 Gauge Catheter at skin depth: 12 cm Test dose: negative and Other (1% lidocaine)  Assessment Events: blood not aspirated, no cerebrospinal fluid, injection not painful, no injection resistance, no paresthesia and negative IV test  Additional Notes Patient identified. Risks, benefits, and alternatives discussed with patient including but not limited to bleeding, infection, nerve damage, paralysis, failed block, incomplete pain control, headache, blood pressure changes, nausea, vomiting, reactions to medication, itching, and postpartum back pain. Confirmed with bedside nurse the patient's most recent platelet count. Confirmed with patient that they are not currently taking any anticoagulation, have any bleeding history, or any family history of bleeding disorders. Patient expressed understanding and wished to proceed. All questions were answered. Sterile technique was used throughout the entire procedure. Please see nursing notes for vital signs.   Crisp LOR on first pass. Test dose was given through epidural catheter and negative prior to continuing to dose epidural or start infusion. Warning signs of high block given to the patient including shortness of breath,  tingling/numbness in hands, complete motor block, or any concerning symptoms with instructions to call for help. Patient was given instructions on fall risk and not to get out of bed. All questions and concerns addressed with instructions to call with any issues or inadequate analgesia.  Reason for block:procedure for pain

## 2023-01-23 NOTE — Progress Notes (Addendum)
Annette Sullivan is a 37 y.o. [phone] at [redacted]w[redacted]d admitted for pre-e w/severe features.  Subjective: No complaints  Objective: BP 115/63   Pulse 94   Temp 97.7 F (36.5 C) (Oral)   Resp 18   Ht 5\' 4"  (1.626 m)   Wt 105.8 kg   LMP 06/01/2022   SpO2 95%   BMI 40.03 kg/m  I/O last 3 completed shifts: In: 5943.1 [P.O.:1860; I.V.:3883.1; IV Piggyback:200] Out: 6350 [Urine:6350] Total I/O In: 1846.5 [P.O.:260; I.V.:1386.5; IV Piggyback:200] Out: 385 [Urine:385]  FHT:  FHR: 130 bpm, variability: min-mod,  accelerations: small occas accel,  decelerations:  Absent UC:   regular, every q1-2 minutes SVE:   Dilation: 5.5 Effacement (%): 60 Station: -2, -3 Exam by:: Lance Morin, MD  Labs: Lab Results  Component Value Date   WBC 11.1 (H) 01/23/2023   HGB 11.0 (L) 01/23/2023   HCT 34.8 (L) 01/23/2023   MCV 85.1 01/23/2023   PLT 233 01/23/2023    Assessment / Plan: Induction of labor due to pre-e w/severe features,  progressing well on pitocin s/p foley balloon that came out spontaneously around 1350.  Labor:  Eval for AROM with fetal hand presenting.  Will hold off on AROM and re-evaluate in a few hours. Tachysystole decrease pitocin. Preeclampsia:  no signs or symptoms of toxicity, AST mildly increased with latest labs Fetal Wellbeing:   Cat 1 - cat 2 but expected decrease of variability as pt is on magnesium sulfate.   Will cont to monitor. Pain Control:  Epidural in place and pt is comfortable I/D:   no s/sxs of infection Anticipated MOD:  NSVD  Purcell Nails, MD 01/23/2023, 2:58 PM

## 2023-01-23 NOTE — Progress Notes (Addendum)
Annette Sullivan is a 37 y.o. [phone] at [redacted]w[redacted]d for pre-eclampsia w/severe features.  Subjective: Denies HA, visual changes or RUQ/abdominal pain.  Feels occasional contractions.  Objective: BP (!) 146/78   Pulse 90   Temp 97.7 F (36.5 C) (Oral)   Resp 16   Ht 5\' 4"  (1.626 m)   Wt 105.8 kg   LMP 06/01/2022   SpO2 99%   BMI 40.03 kg/m  I/O last 3 completed shifts: In: 5943.1 [P.O.:1860; I.V.:3883.1; IV Piggyback:200] Out: 6350 [Urine:6350] Total I/O In: 384.4 [P.O.:260; I.V.:124.4] Out: 0   FHT:  FHR: 130 bpm, variability: moderate,  accelerations:  Present,  decelerations:  Absent UC:   irregular SVE:   Dilation: 1cm Effacement (%): Thick Station: -3 Exam by:: A.Bella Brummet, MD Vtx on exam  Labs: Lab Results  Component Value Date   WBC 9.8 01/23/2023   HGB 11.5 (L) 01/23/2023   HCT 36.4 01/23/2023   MCV 85.4 01/23/2023   PLT 243 01/23/2023    Assessment / Plan: Induction of labor due to preeclampsia,  progressing well on pitocin  Labor:  Currently on pitocin without much progress.  Plan to place foley balloon in cervix after discussing options rba with patient.  Foley balloon (60cc) placed without difficulty with patient in stirrups. Preeclampsia:  no signs or symptoms of toxicity on MgSO4 Fetal Wellbeing:  Category I Pain Control:   upon request I/D:   GBS + on PCN no s/sxs of infection Anticipated MOD:  NSVD  Purcell Nails, MD 01/23/2023, 9:24 AM

## 2023-01-23 NOTE — Progress Notes (Signed)
Patient ID: Annette Sullivan, female   DOB: 12/02/1985, 37 y.o.   MRN: [id number] VE 6-7cm/70%/-3 Cat 1 tracing Fetal hand still presenting with enough room for entire arm to come down d/t head being high and wrist and lower arm palpable Titrate pitocin with attempt to get fetal head to descend into pelvis with retraction of upper extremity Bedside ultrasound cephalic VS reviewed

## 2023-01-23 NOTE — Anesthesia Preprocedure Evaluation (Signed)
Anesthesia Evaluation  Patient identified by MRN, date of birth, ID band Patient awake    Reviewed: Allergy & Precautions, NPO status , Patient's Chart, lab work & pertinent test results  History of Anesthesia Complications Negative for: history of anesthetic complications  Airway Mallampati: II  TM Distance: >3 FB Neck ROM: Full    Dental no notable dental hx.    Pulmonary neg pulmonary ROS   Pulmonary exam normal        Cardiovascular hypertension, Normal cardiovascular exam     Neuro/Psych  Headaches  Anxiety        GI/Hepatic negative GI ROS, Neg liver ROS,,,  Endo/Other    Morbid obesity  Renal/GU negative Renal ROS  negative genitourinary   Musculoskeletal negative musculoskeletal ROS (+)    Abdominal   Peds  Hematology negative hematology ROS (+)   Anesthesia Other Findings Day of surgery medications reviewed with patient.  Reproductive/Obstetrics (+) Pregnancy (severe preE on Mg)                             Anesthesia Physical Anesthesia Plan  ASA: 3 and emergent  Anesthesia Plan: Epidural   Post-op Pain Management: Ofirmev IV (intra-op)*   Induction:   PONV Risk Score and Plan: 2 and Treatment may vary due to age or medical condition, Ondansetron and Dexamethasone  Airway Management Planned: Natural Airway  Additional Equipment: Fetal Monitoring  Intra-op Plan:   Post-operative Plan:   Informed Consent: I have reviewed the patients History and Physical, chart, labs and discussed the procedure including the risks, benefits and alternatives for the proposed anesthesia with the patient or authorized representative who has indicated his/her understanding and acceptance.       Plan Discussed with: CRNA  Anesthesia Plan Comments: (Labor epidural to be used for Code Cesarean (fetal bradycardia). Stephannie Peters, MD)        Anesthesia Quick Evaluation

## 2023-01-24 ENCOUNTER — Other Ambulatory Visit: Payer: Self-pay

## 2023-01-24 ENCOUNTER — Encounter (HOSPITAL_COMMUNITY)
Admission: AD | Disposition: A | Discharge status: Home / Self Care | Payer: Self-pay | Source: Home / Self Care | Attending: Obstetrics and Gynecology

## 2023-01-24 ENCOUNTER — Encounter (HOSPITAL_COMMUNITY): Payer: Self-pay | Admitting: Obstetrics and Gynecology

## 2023-01-24 DIAGNOSIS — O09523 Supervision of elderly multigravida, third trimester: Secondary | ICD-10-CM | POA: Diagnosis not present

## 2023-01-24 DIAGNOSIS — O99820 Streptococcus B carrier state complicating pregnancy: Secondary | ICD-10-CM | POA: Diagnosis not present

## 2023-01-24 DIAGNOSIS — Z3A33 33 weeks gestation of pregnancy: Secondary | ICD-10-CM

## 2023-01-24 DIAGNOSIS — Z302 Encounter for sterilization: Secondary | ICD-10-CM | POA: Diagnosis not present

## 2023-01-24 DIAGNOSIS — O1414 Severe pre-eclampsia complicating childbirth: Secondary | ICD-10-CM | POA: Diagnosis not present

## 2023-01-24 HISTORY — PX: TUBAL LIGATION: SHX77

## 2023-01-24 LAB — CBC
HCT: 37.3 % (ref 36.0–46.0)
HCT: 33.2 % — ABNORMAL LOW (ref 36.0–46.0)
Hemoglobin: 11.5 g/dL — ABNORMAL LOW (ref 12.0–15.0)
Hemoglobin: 10.3 g/dL — ABNORMAL LOW (ref 12.0–15.0)
MCH: 27.4 pg (ref 26.0–34.0)
MCH: 26.8 pg (ref 26.0–34.0)
MCHC: 30.8 g/dL (ref 30.0–36.0)
MCHC: 31 g/dL (ref 30.0–36.0)
MCV: 88.8 fL (ref 80.0–100.0)
MCV: 86.2 fL (ref 80.0–100.0)
Platelets: 207 10*3/uL (ref 150–400)
Platelets: 219 10*3/uL (ref 150–400)
RBC: 4.2 MIL/uL (ref 3.87–5.11)
RBC: 3.85 MIL/uL — ABNORMAL LOW (ref 3.87–5.11)
RDW: 14.7 % (ref 11.5–15.5)
RDW: 14.6 % (ref 11.5–15.5)
WBC: 12.2 10*3/uL — ABNORMAL HIGH (ref 4.0–10.5)
WBC: 11.5 10*3/uL — ABNORMAL HIGH (ref 4.0–10.5)
nRBC: 0 % (ref 0.0–0.2)
nRBC: 0 % (ref 0.0–0.2)

## 2023-01-24 LAB — COMPREHENSIVE METABOLIC PANEL
ALT: 13 U/L (ref 0–44)
AST: 19 U/L (ref 15–41)
Albumin: 2.4 g/dL — ABNORMAL LOW (ref 3.5–5.0)
Alkaline Phosphatase: 66 U/L (ref 38–126)
Anion gap: 9 (ref 5–15)
BUN: <5 mg/dL — ABNORMAL LOW (ref 6–20)
CO2: 21 mmol/L — ABNORMAL LOW (ref 22–32)
Calcium: 7.6 mg/dL — ABNORMAL LOW (ref 8.9–10.3)
Chloride: 103 mmol/L (ref 98–111)
Creatinine, Ser: 0.73 mg/dL (ref 0.44–1.00)
GFR, Estimated: >60 mL/min (ref >60)
Glucose, Bld: 154 mg/dL — ABNORMAL HIGH (ref 70–99)
Potassium: 3.8 mmol/L (ref 3.5–5.1)
Sodium: 133 mmol/L — ABNORMAL LOW (ref 135–145)
Total Bilirubin: 0.7 mg/dL (ref 0.3–1.2)
Total Protein: 5.6 g/dL — ABNORMAL LOW (ref 6.5–8.1)

## 2023-01-24 LAB — MAGNESIUM: Magnesium: 4.6 mg/dL — ABNORMAL HIGH (ref 1.7–2.4)

## 2023-01-24 SURGERY — Surgical Case
Anesthesia: Epidural

## 2023-01-24 MED ORDER — MENTHOL 3 MG MT LOZG: 1.0000 | LOZENGE | OROMUCOSAL | Status: DC | PRN

## 2023-01-24 MED ORDER — DROPERIDOL 2.5 MG/ML IJ SOLN: 0.6250 mg | Freq: Once | INTRAMUSCULAR | Status: DC | PRN

## 2023-01-24 MED ORDER — CHLOROPROCAINE HCL (PF) 3 % IJ SOLN
INTRAMUSCULAR | Status: DC | PRN
  Administered 2023-01-24: 20 mL via EPIDURAL

## 2023-01-24 MED ORDER — DIBUCAINE (PERIANAL) 1 % EX OINT: 1.0000 | TOPICAL_OINTMENT | CUTANEOUS | Status: DC | PRN

## 2023-01-24 MED ORDER — KETOROLAC TROMETHAMINE 30 MG/ML IJ SOLN: 30.0000 mg | Freq: Four times a day (QID) | INTRAMUSCULAR | Status: DC | PRN

## 2023-01-24 MED ORDER — SODIUM CHLORIDE 0.9% FLUSH: 3.0000 mL | INTRAVENOUS | Status: DC | PRN

## 2023-01-24 MED ORDER — MORPHINE SULFATE (PF) 0.5 MG/ML IJ SOLN
INTRAMUSCULAR | Status: AC
  Filled 2023-01-24: qty 10

## 2023-01-24 MED ORDER — OXYTOCIN-SODIUM CHLORIDE 30-0.9 UT/500ML-% IV SOLN: 2.5000 [IU]/h | INTRAVENOUS | Status: AC

## 2023-01-24 MED ORDER — MORPHINE SULFATE (PF) 0.5 MG/ML IJ SOLN
INTRAMUSCULAR | Status: DC | PRN
  Administered 2023-01-24: 3 mg via EPIDURAL

## 2023-01-24 MED ORDER — KETOROLAC TROMETHAMINE 30 MG/ML IJ SOLN
INTRAMUSCULAR | Status: AC
  Filled 2023-01-24: qty 1

## 2023-01-24 MED ORDER — IBUPROFEN 600 MG PO TABS
600.0000 mg | ORAL_TABLET | Freq: Four times a day (QID) | ORAL | Status: DC
  Administered 2023-01-25 – 2023-01-27 (×9): 600 mg via ORAL
  Filled 2023-01-24 (×9): qty 1

## 2023-01-24 MED ORDER — WITCH HAZEL-GLYCERIN EX PADS: 1.0000 | MEDICATED_PAD | CUTANEOUS | Status: DC | PRN

## 2023-01-24 MED ORDER — OXYCODONE HCL 5 MG PO TABS
5.0000 mg | ORAL_TABLET | ORAL | Status: DC | PRN
  Administered 2023-01-25: 10 mg via ORAL
  Administered 2023-01-25 – 2023-01-26 (×4): 5 mg via ORAL
  Administered 2023-01-26: 10 mg via ORAL
  Administered 2023-01-27 – 2023-01-28 (×2): 5 mg via ORAL
  Filled 2023-01-24 (×2): qty 1
  Filled 2023-01-24: qty 2
  Filled 2023-01-24 (×3): qty 1
  Filled 2023-01-24: qty 2
  Filled 2023-01-24: qty 1

## 2023-01-24 MED ORDER — DEXAMETHASONE SODIUM PHOSPHATE 10 MG/ML IJ SOLN
INTRAMUSCULAR | Status: AC
  Filled 2023-01-24: qty 1

## 2023-01-24 MED ORDER — CEFAZOLIN SODIUM-DEXTROSE 2-3 GM-%(50ML) IV SOLR
INTRAVENOUS | Status: DC | PRN
  Administered 2023-01-24: 2 g via INTRAVENOUS

## 2023-01-24 MED ORDER — ACETAMINOPHEN 500 MG PO TABS
1000.0000 mg | ORAL_TABLET | Freq: Four times a day (QID) | ORAL | Status: DC
  Administered 2023-01-24 – 2023-01-28 (×17): 1000 mg via ORAL
  Filled 2023-01-24 (×18): qty 2

## 2023-01-24 MED ORDER — KETOROLAC TROMETHAMINE 30 MG/ML IJ SOLN: 30.0000 mg | Freq: Four times a day (QID) | INTRAMUSCULAR | Status: DC

## 2023-01-24 MED ORDER — SIMETHICONE 80 MG PO CHEW
80.0000 mg | CHEWABLE_TABLET | ORAL | Status: DC | PRN
  Administered 2023-01-25: 80 mg via ORAL

## 2023-01-24 MED ORDER — PHENYLEPHRINE 80 MCG/ML (10ML) SYRINGE FOR IV PUSH (FOR BLOOD PRESSURE SUPPORT)
PREFILLED_SYRINGE | INTRAVENOUS | Status: DC | PRN
  Administered 2023-01-24: 80 ug via INTRAVENOUS

## 2023-01-24 MED ORDER — SENNOSIDES-DOCUSATE SODIUM 8.6-50 MG PO TABS
2.0000 | ORAL_TABLET | Freq: Every day | ORAL | Status: DC
  Administered 2023-01-25 – 2023-01-28 (×4): 2 via ORAL
  Filled 2023-01-24 (×4): qty 2

## 2023-01-24 MED ORDER — ENOXAPARIN SODIUM 60 MG/0.6ML IJ SOSY
50.0000 mg | PREFILLED_SYRINGE | INTRAMUSCULAR | Status: DC
  Administered 2023-01-24 – 2023-01-27 (×4): 50 mg via SUBCUTANEOUS
  Filled 2023-01-24 (×4): qty 0.6

## 2023-01-24 MED ORDER — LACTATED RINGERS IV SOLN: INTRAVENOUS | Status: DC

## 2023-01-24 MED ORDER — DEXAMETHASONE SODIUM PHOSPHATE 10 MG/ML IJ SOLN
INTRAMUSCULAR | Status: DC | PRN
  Administered 2023-01-24: 4 mg via INTRAVENOUS

## 2023-01-24 MED ORDER — FENTANYL CITRATE (PF) 100 MCG/2ML IJ SOLN: 25.0000 ug | INTRAMUSCULAR | Status: DC | PRN

## 2023-01-24 MED ORDER — DIPHENHYDRAMINE HCL 25 MG PO CAPS: 25.0000 mg | ORAL_CAPSULE | Freq: Four times a day (QID) | ORAL | Status: DC | PRN

## 2023-01-24 MED ORDER — SIMETHICONE 80 MG PO CHEW
80.0000 mg | CHEWABLE_TABLET | Freq: Three times a day (TID) | ORAL | Status: DC
  Administered 2023-01-24 – 2023-01-28 (×12): 80 mg via ORAL
  Filled 2023-01-24 (×14): qty 1

## 2023-01-24 MED ORDER — NALOXONE HCL 4 MG/10ML IJ SOLN: 1.0000 ug/kg/h | INTRAVENOUS | Status: DC | PRN

## 2023-01-24 MED ORDER — KETOROLAC TROMETHAMINE 30 MG/ML IJ SOLN
30.0000 mg | Freq: Once | INTRAMUSCULAR | Status: AC
  Administered 2023-01-24: 30 mg via INTRAVENOUS

## 2023-01-24 MED ORDER — ACETAMINOPHEN 500 MG PO TABS: 1000.0000 mg | ORAL_TABLET | Freq: Four times a day (QID) | ORAL | Status: DC

## 2023-01-24 MED ORDER — DIPHENHYDRAMINE HCL 25 MG PO CAPS: 25.0000 mg | ORAL_CAPSULE | ORAL | Status: DC | PRN

## 2023-01-24 MED ORDER — OXYTOCIN-SODIUM CHLORIDE 30-0.9 UT/500ML-% IV SOLN
INTRAVENOUS | Status: DC | PRN
  Administered 2023-01-24: 62.5 mL/h via INTRAVENOUS

## 2023-01-24 MED ORDER — NALOXONE HCL 0.4 MG/ML IJ SOLN: 0.4000 mg | INTRAMUSCULAR | Status: DC | PRN

## 2023-01-24 MED ORDER — IBUPROFEN 600 MG PO TABS: 600.0000 mg | ORAL_TABLET | Freq: Four times a day (QID) | ORAL | Status: DC

## 2023-01-24 MED ORDER — PROPOFOL 10 MG/ML IV BOLUS
INTRAVENOUS | Status: AC
  Filled 2023-01-24: qty 20

## 2023-01-24 MED ORDER — DIPHENHYDRAMINE HCL 50 MG/ML IJ SOLN: 12.5000 mg | INTRAMUSCULAR | Status: DC | PRN

## 2023-01-24 MED ORDER — ONDANSETRON HCL 4 MG/2ML IJ SOLN
INTRAMUSCULAR | Status: AC
  Filled 2023-01-24: qty 2

## 2023-01-24 MED ORDER — ONDANSETRON HCL 4 MG/2ML IJ SOLN: 4.0000 mg | Freq: Three times a day (TID) | INTRAMUSCULAR | Status: DC | PRN

## 2023-01-24 MED ORDER — COCONUT OIL OIL
1.0000 | TOPICAL_OIL | Status: DC | PRN
  Administered 2023-01-24 – 2023-01-27 (×2): 1 via TOPICAL

## 2023-01-24 MED ORDER — ONDANSETRON HCL 4 MG/2ML IJ SOLN
INTRAMUSCULAR | Status: DC | PRN
  Administered 2023-01-24: 4 mg via INTRAVENOUS

## 2023-01-24 MED ORDER — TETANUS-DIPHTH-ACELL PERTUSSIS 5-2.5-18.5 LF-MCG/0.5 IM SUSY: 0.5000 mL | PREFILLED_SYRINGE | Freq: Once | INTRAMUSCULAR | Status: DC

## 2023-01-24 MED ORDER — PRENATAL MULTIVITAMIN CH
1.0000 | ORAL_TABLET | Freq: Every day | ORAL | Status: DC
  Administered 2023-01-24 – 2023-01-28 (×5): 1 via ORAL
  Filled 2023-01-24 (×5): qty 1

## 2023-01-24 MED ORDER — ZOLPIDEM TARTRATE 5 MG PO TABS: 5.0000 mg | ORAL_TABLET | Freq: Every evening | ORAL | Status: DC | PRN

## 2023-01-24 MED ORDER — SODIUM CHLORIDE 0.9 % IV SOLN
INTRAVENOUS | Status: DC | PRN
  Administered 2023-01-24: 500 mg via INTRAVENOUS

## 2023-01-24 MED ORDER — KETOROLAC TROMETHAMINE 30 MG/ML IJ SOLN
30.0000 mg | Freq: Four times a day (QID) | INTRAMUSCULAR | Status: AC
  Administered 2023-01-24 – 2023-01-25 (×4): 30 mg via INTRAVENOUS
  Filled 2023-01-24 (×4): qty 1

## 2023-01-24 SURGICAL SUPPLY — 36 items
APL PRP STRL LF DISP 70% ISPRP (MISCELLANEOUS) ×4
APL SKNCLS STERI-STRIP NONHPOA (GAUZE/BANDAGES/DRESSINGS) ×2
BENZOIN TINCTURE PRP APPL 2/3 (GAUZE/BANDAGES/DRESSINGS) ×2 IMPLANT
CHLORAPREP W/TINT 26 (MISCELLANEOUS) ×4 IMPLANT
CLAMP UMBILICAL CORD (MISCELLANEOUS) ×2 IMPLANT
CLOTH BEACON ORANGE TIMEOUT ST (SAFETY) ×2 IMPLANT
DRSG OPSITE POSTOP 4X10 (GAUZE/BANDAGES/DRESSINGS) ×2 IMPLANT
ELECT REM PT RETURN 9FT ADLT (ELECTROSURGICAL) ×2
ELECTRODE REM PT RTRN 9FT ADLT (ELECTROSURGICAL) ×2 IMPLANT
EXTRACTOR VACUUM M CUP 4 TUBE (SUCTIONS) IMPLANT
GAUZE SPONGE 4X4 12PLY STRL LF (GAUZE/BANDAGES/DRESSINGS) IMPLANT
GLOVE BIO SURGEON STRL SZ7.5 (GLOVE) ×2 IMPLANT
GLOVE BIOGEL PI IND STRL 7.0 (GLOVE) ×2 IMPLANT
GLOVE BIOGEL PI IND STRL 7.5 (GLOVE) ×2 IMPLANT
GOWN STRL REUS W/TWL LRG LVL3 (GOWN DISPOSABLE) ×4 IMPLANT
KIT ABG SYR 3ML LUER SLIP (SYRINGE) IMPLANT
LIGASURE IMPACT 36 18CM CVD LR (INSTRUMENTS) IMPLANT
NDL HYPO 25X5/8 SAFETYGLIDE (NEEDLE) IMPLANT
NEEDLE HYPO 25X5/8 SAFETYGLIDE (NEEDLE) IMPLANT
NS IRRIG 1000ML POUR BTL (IV SOLUTION) ×2 IMPLANT
PACK C SECTION WH (CUSTOM PROCEDURE TRAY) ×2 IMPLANT
PAD OB MATERNITY 4.3X12.25 (PERSONAL CARE ITEMS) ×2 IMPLANT
RTRCTR C-SECT PINK 25CM LRG (MISCELLANEOUS) ×2 IMPLANT
STRIP CLOSURE SKIN 1/2X4 (GAUZE/BANDAGES/DRESSINGS) ×2 IMPLANT
SUT CHROMIC 2 0 CT 1 (SUTURE) ×2 IMPLANT
SUT MNCRL 0 VIOLET CTX 36 (SUTURE) ×2 IMPLANT
SUT MNCRL AB 3-0 PS2 27 (SUTURE) ×2 IMPLANT
SUT PLAIN 2 0 XLH (SUTURE) ×2 IMPLANT
SUT VIC AB 0 CT1 36 (SUTURE) ×2 IMPLANT
SUT VIC AB 0 CTX 36 (SUTURE) ×6
SUT VIC AB 0 CTX36XBRD ANBCTRL (SUTURE) ×6 IMPLANT
SUT VIC AB 2-0 SH 27 (SUTURE)
SUT VIC AB 2-0 SH 27XBRD (SUTURE) IMPLANT
TOWEL OR 17X24 6PK STRL BLUE (TOWEL DISPOSABLE) ×2 IMPLANT
TRAY FOLEY W/BAG SLVR 14FR LF (SET/KITS/TRAYS/PACK) ×2 IMPLANT
WATER STERILE IRR 1000ML POUR (IV SOLUTION) ×2 IMPLANT

## 2023-01-24 NOTE — Anesthesia Postprocedure Evaluation (Signed)
Anesthesia Post Note  Patient: Annette Sullivan  Procedure(s) Performed: CESAREAN SECTION BILATERAL TUBAL LIGATION (Bilateral)     Patient location during evaluation: PACU Anesthesia Type: Epidural Level of consciousness: awake and alert Pain management: pain level controlled Vital Signs Assessment: post-procedure vital signs reviewed and stable Respiratory status: spontaneous breathing, nonlabored ventilation and respiratory function stable Cardiovascular status: blood pressure returned to baseline Postop Assessment: epidural receding, no apparent nausea or vomiting, no headache and no backache Anesthetic complications: no   No notable events documented.  Last Vitals:  Vitals:   01/24/23 0459 01/24/23 0500  BP: 126/66 126/66  Pulse: 99 100  Resp:  18  Temp: 37.1 C   SpO2: 97% 96%    Last Pain:  Vitals:   01/24/23 0500  TempSrc:   PainSc: 6    Pain Goal: Patients Stated Pain Goal: 3 (01/22/23 0810)                 Shanda Howells

## 2023-01-24 NOTE — Progress Notes (Signed)
Annette Sullivan is a 37 y.o. [phone] at [redacted]w[redacted]d   Subjective: Denies HA, visual changes or RUQ/Abdominal pain  Objective: BP (!) 140/88   Pulse 86   Temp 97.9 F (36.6 C) (Oral)   Resp 16   Ht 5\' 4"  (1.626 m)   Wt 105.8 kg   LMP 06/01/2022   SpO2 98%   BMI 40.03 kg/m  I/O last 3 completed shifts: In: 7324.9 [P.O.:1480; I.V.:5046.1; Other:98.8; IV Piggyback:700] Out: 5295 [Urine:5295] Total I/O In: -  Out: 1350 [Urine:1350]  FHT:  cat 1 mod variability accels and no decels UC:   none SVE:   Dilation: 6.5 Effacement (%): 70 Station: -3 Exam by:: Marquita C, RN  Labs: Lab Results  Component Value Date   WBC 11.1 (H) 01/23/2023   HGB 11.0 (L) 01/23/2023   HCT 34.8 (L) 01/23/2023   MCV 85.1 01/23/2023   PLT 233 01/23/2023    Assessment / Plan: AROM performed with clear fluid and IUPC placed.  Fetal hand still present.  Labor:  Cont to titrate pitocin as indicated Preeclampsia:  no signs or symptoms of toxicity Fetal Wellbeing:   Cat 1 immediately after AROM.  Shortly thereafter fetal tachysystole occurred and fetus had a prolonged decel.  Given the hand presentation, pt emergently taken back for c-section and discussion was held prior to AROM about the possibility that an emergent c-section would be needed and rba reviewed. Pain Control:  Epidural I/D:   no s/sxs of infection Anticipated MOD:   C-Section  Purcell Nails, MD 01/24/2023, 2:19 AM

## 2023-01-24 NOTE — Transfer of Care (Signed)
Immediate Anesthesia Transfer of Care Note  Patient: Annette Sullivan  Procedure(s) Performed: CESAREAN SECTION BILATERAL TUBAL LIGATION (Bilateral)  Patient Location: PACU  Anesthesia Type:Regional  Level of Consciousness: awake, alert , oriented, and patient cooperative  Airway & Oxygen Therapy: Patient Spontanous Breathing  Post-op Assessment: Report given to RN and Post -op Vital signs reviewed and stable  Post vital signs: Reviewed and stable  Last Vitals:  Vitals Value Taken Time  BP 105/49 01/24/23 0331  Temp    Pulse 103 01/24/23 0339  Resp 27 01/24/23 0339  SpO2 87 % 01/24/23 0339  Vitals shown include unvalidated device data.  Last Pain:  Vitals:   01/24/23 0102  TempSrc:   PainSc: 0-No pain      Patients Stated Pain Goal: 3 (01/22/23 0810)  Complications: No notable events documented.

## 2023-01-24 NOTE — Op Note (Signed)
Cesarean Section Procedure Note  Indications: Lengthy discussion with patient about options given hand presentation.  Pt declined going straight to c-section and would like to try AROM.  Risks benefits and alternatives reviewed and patient understood she may still need a c-section and it could be emergent.  AROM was performed and hand remained, fluid was clear.  Initially pt did well and ctxs were spaced initially q39min and quickly pt started contracting more fetal with deep decels.  Pitocin held and terbutaline administered as well as other resuscitative measures.  Pt verbally consented for stat c-section and sterilization.   Pre-operative Diagnosis: 1.33 6/7wks 2.Pre-Eclampsia with Severe Features 3.IOL 4.Fetal Intolerance to Labor 5.Desires sterilization   Post-operative Diagnosis: 1.33 6/7wks 2. PPre-eclampsia with Severe Features 3.IOL 4.Fetal Intolerance to Labor 5.Desires Sterilization  Procedure: 1.Primary Low Transverse C-Section 2.Bilateral Salpingectomy  Surgeon: Osborn Coho, MD    Assistants: Dr. Macon Large  Anesthesia: Regional  Anesthesiologist: Kaylyn Layer, MD   Procedure Details  The patient was taken to the operating room after the risks, benefits, complications, treatment options, and expected outcomes were discussed with the patient.  The patient concurred with the proposed plan, giving informed consent which was signed and witnessed. The patient was taken to Operating Room C and c-section performed emergently after prepping abdomen.  After induction of anesthesia by obtaining a surgical level via the spinal, the patient was prepped and draped in the usual sterile manner. A Pfannenstiel skin incision was made and carried down through the subcutaneous tissue to the underlying layer of fascia.  The fascia was incised bilaterally and extended transversely bilaterally bluntly. The muscle was separated in the midline and peritoneum was identified, entered bluntly and extended  manually.  The utero-vesical peritoneal reflection was incised transversely and the bladder flap was bluntly freed from the lower uterine segment. A low transverse uterine incision was made with the scalpel and extended bilaterally manually.  The infant was delivered in vertex position without difficulty. After the umbilical cord was clamped and cut, the infant was handed to the awaiting pediatricians.  Cord blood was obtained for evaluation.  The placenta was removed intact and appeared to be within normal limits.  An Alexis self-retaining retractor was placed. The uterus was cleared of all clots and debris. The uterine incision and bilateral extensions were closed with running interlocking sutures of 0 monocryl and a second imbricating layer was performed as well.  Bilateral tubes and ovaries appeared to be within normal limits.  Good hemostasis was noted.  Copious irrigation was performed until clear.  The uterus was exteriorized and the left fallopian tube grasped in the midportion with a babcock after carrying it out to its fimbriated end and excised with the ligasure.  The same was done on the contralateral side.  The peritoneum was repaired with 2-0 chromic via a running suture.  The fascia was reapproximated with a running suture of 0 Vicryl. The subcutaneous tissue was reapproximated with 3 interrupted sutures of 2-0 plain.  The skin was reapproximated with a subcuticular suture of 3-0 monocryl.  Steristrips were applied with benzoin.  Instrument, sponge, and needle counts were correct prior to abdominal closure and at the conclusion of the case.  The patient was awaiting transfer to the recovery room in good condition.  Findings: Live female infant with Apgars 8 at one minute and 9 at five minutes.  Normal appearing bilateral ovaries and fallopian tubes were noted.  Estimated Blood Loss:  172 ml  Drains: foley to gravity 100 cc         Total IV Fluids: 800 ml         Specimens to  Pathology: Placenta and Bilateral Fallopian Tubes         Complications:  None; patient tolerated the procedure well.         Disposition: PACU - hemodynamically stable.         Condition: stable  Attending Attestation: I performed the procedure.  I was present and scrubbed and the assistant was required due to complexity of anatomy.

## 2023-01-24 NOTE — Lactation Note (Signed)
This note was copied from a baby'Annette chart.  NICU Lactation Consultation Note  Patient Name: Annette Sullivan [id number] Date: 01/24/2023 Age:37 hours  Reason for consult: Initial assessment; Other (Comment); NICU baby; Preterm <34wks; Infant < 6lbs (AMA, gHTN, Pre-E)  SUBJECTIVE Visited with family of 46 hours old pre-term NICU female; Annette Sullivan is a P4 and experienced breastfeeding. Her plan is do both, direct breastfeeding along with pumping & bottle feeding with EBM/formula. She'Annette already pumping but hasn't got anything with the pump (only with hand expression). Explained that the purpose of pumping this early on is mainly for breast stimulation and not to get volume. Provided a pumping top is size L "Wallace Cullens" for hands on pumping. Reviewed lactogenesis II, pumping schedule and anticipatory guidelines.  OBJECTIVE Infant data: Mother'Annette Current Feeding Choice: Breast Milk and Donor Milk  Infant feeding assessment Scale for Readiness: 3   Maternal data: [id number]  C-Section, Low Transverse Has patient been taught Hand Expression?: Yes Hand Expression Comments: small droplets of colostrum noted Significant Breast History:: (++) breast changes during the pregnancy Current breast feeding challenges:: NICU admission Does the patient have breastfeeding experience prior to this delivery?: Yes How long did the patient breastfeed?: 1st one for 17 month, 2nd one for 12 months and 3rd one for 8 months Pumping frequency: 2 times since birth; she'Annette at 12 hours post-partum Pumped volume: 0 mL Flange Size: 24 Risk factor for low milk supply:: prematurity, infant separation, on Mag  Pump: Personal ([employer] DEBP from insurance)  ASSESSMENT Infant: Feeding Status: Scheduled 9-12-3-6  Maternal: Milk volume: Normal  INTERVENTIONS/PLAN Interventions: Interventions: Breast feeding basics reviewed; Coconut oil; DEBP; Education; [employer] brochure Tools: Pump; Flanges; Coconut oil; Hands-free pumping top  (Size L "Gray") Pump Education: Setup, frequency, and cleaning; Milk Storage  Plan: Encouraged pumping every 3 hours, ideally 8 pumping sessions/24 hours Breast, massage, hand expression and coconut oil were also encouraged prior pumping  No other support person at this time. All questions and concerns answered, family to contact [facility] services PRN.  Consult Status: NICU follow-up NICU Follow-up type: New admission follow up   Annette Sullivan Annette Sullivan 01/24/2023, 3:38 PM

## 2023-01-25 ENCOUNTER — Ambulatory Visit: Payer: 59

## 2023-01-25 DIAGNOSIS — D62 Acute posthemorrhagic anemia: Secondary | ICD-10-CM | POA: Diagnosis not present

## 2023-01-25 DIAGNOSIS — Z9851 Tubal ligation status: Secondary | ICD-10-CM

## 2023-01-25 DIAGNOSIS — Z98891 History of uterine scar from previous surgery: Secondary | ICD-10-CM

## 2023-01-25 LAB — CBC
HCT: 30.6 % — ABNORMAL LOW (ref 36.0–46.0)
Hemoglobin: 9.5 g/dL — ABNORMAL LOW (ref 12.0–15.0)
MCH: 27.5 pg (ref 26.0–34.0)
MCHC: 31 g/dL (ref 30.0–36.0)
MCV: 88.4 fL (ref 80.0–100.0)
Platelets: 179 10*3/uL (ref 150–400)
RBC: 3.46 MIL/uL — ABNORMAL LOW (ref 3.87–5.11)
RDW: 14.7 % (ref 11.5–15.5)
WBC: 8.2 10*3/uL (ref 4.0–10.5)
nRBC: 0 % (ref 0.0–0.2)

## 2023-01-25 LAB — SURGICAL PATHOLOGY

## 2023-01-25 MED ORDER — NIFEDIPINE ER OSMOTIC RELEASE 30 MG PO TB24
30.0000 mg | ORAL_TABLET | Freq: Every day | ORAL | Status: DC
  Administered 2023-01-25 – 2023-01-26 (×2): 30 mg via ORAL
  Filled 2023-01-25 (×2): qty 1

## 2023-01-25 MED ORDER — POLYSACCHARIDE IRON COMPLEX 150 MG PO CAPS
150.0000 mg | ORAL_CAPSULE | Freq: Every day | ORAL | Status: DC
  Administered 2023-01-25 – 2023-01-28 (×4): 150 mg via ORAL
  Filled 2023-01-25 (×4): qty 1

## 2023-01-25 NOTE — Progress Notes (Addendum)
Annette Sullivan [id number] Postpartum Postoperative Day # 1  SOMIA MAXIN, [id number], [redacted]w[redacted]d, S/P Primary LT Cesarean Section due to pt was admitted on 6/28 for PreE with SF. Was monitor but had refractory HA and proceeded with IOL at 33.6 weeks, she progressed with foley bulb, Pitocin and AROM to 6-7 CM, then was brought to the OR on 7/1 for PCS with DR Su Hilt for fetal intolerance for labor, and BTL. QBL was , hgb drop 11.1-9.5, on PO Iron and asymptomatic, during labor her AST was slightly elevated but returned to normal AST 17-14-48-19, ALT 16-16-18-13, PCR was 0.09, other labs unremarkable, pt was placed on Mag through labor and 24 hours PP, was on procardia during pregnancy in third trimester but was stopped during labor progress and has not been restarted, PP BP have been normotensive 126/78. Had viable baby female went to the NICU.  Patient Active Problem List   Diagnosis Date Noted   Acute blood loss anemia 01/25/2023   S/P cesarean section 01/25/2023   S/P tubal ligation 01/25/2023   Preeclampsia, severe 01/21/2023   Gestational hypertension 01/21/2023   Anemia of pregnancy 12/22/2022   Gestational hypertension w/o significant proteinuria in 3rd trimester 12/11/2022   Request for sterilization 01/23/2018   Supervision of other high risk pregnancies, third trimester 10/21/2017   History of gestational hypertension 10/21/2017     Active Ambulatory Problems    Diagnosis Date Noted   Supervision of other high risk pregnancies, third trimester 10/21/2017   Request for sterilization 01/23/2018   History of gestational hypertension 10/21/2017   Gestational hypertension w/o significant proteinuria in 3rd trimester 12/11/2022   Anemia of pregnancy 12/22/2022   Resolved Ambulatory Problems    Diagnosis Date Noted   Gestational hypertension 04/18/2018   Vaginal delivery 04/20/2018   Normal postpartum course 04/20/2018   Postpartum anemia 04/20/2018   Anxiety 10/21/2017   Umbilical  hernia 05/13/2015   Past Medical History:  Diagnosis Date   Anemia    Headache(784.0)    Hypertension    No pertinent past medical history    Obesity (BMI 30-39.9)      Subjective: Patient up ad lib, denies syncope or dizziness. Reports consuming regular diet without issues and denies N/V. Patient reports 0 bowel movement + passing flatus.  Denies issues with urination and reports bleeding is "lighter."  Patient is Pump feeding and reports going well.  Desires received BTL during surgery for postpartum contraception.  Pain is being appropriately managed with use of po meds. Denies HA RUQ pain or vision changes today.    Objective: Patient Vitals for the past 24 hrs:  BP Temp Temp src Pulse Resp SpO2  01/25/23 0432 126/78 98.4 F (36.9 C) Oral 78 16 98 %  01/25/23 0100 -- -- -- -- 18 --  01/24/23 2352 129/78 98.1 F (36.7 C) Oral 77 18 97 %  01/24/23 2300 -- -- -- -- 18 --  01/24/23 2200 -- -- -- -- 16 --  01/24/23 2100 -- -- -- -- 16 --  01/24/23 2030 134/75 98.7 F (37.1 C) Oral 82 18 96 %  01/24/23 1859 -- -- -- -- 20 --  01/24/23 1800 -- -- -- -- 18 --  01/24/23 1700 -- -- -- -- 19 --  01/24/23 1600 -- -- -- -- 20 --  01/24/23 1505 (!) 146/74 -- -- 81 -- 100 %  01/24/23 1500 136/79 98.4 F (36.9 C) -- 86 19 99 %  01/24/23 1455 123/77 -- --  79 -- --  01/24/23 1400 -- -- -- -- 18 --  01/24/23 1156 123/66 98.4 F (36.9 C) Oral 91 17 95 %  01/24/23 1100 -- -- -- -- 16 --  01/24/23 1000 -- -- -- -- 17 --  01/24/23 0914 -- -- -- -- 16 --  01/24/23 0846 123/73 98.7 F (37.1 C) Oral 91 16 98 %  01/24/23 0804 -- -- -- -- 16 --  01/24/23 0741 -- -- -- -- 17 --     Physical Exam:  General: alert, cooperative, and appears stated age Mood/Affect: happy Lungs: clear to auscultation, no wheezes, rales or rhonchi, symmetric air entry.  Heart: normal rate, regular rhythm, normal S1, S2, no murmurs, rubs, clicks or gallops. Breast: breasts appear normal, no suspicious masses, no  skin or nipple changes or axillary nodes. Abdomen:  + bowel sounds, soft, non-tender Incision: healing well, no significant drainage, no dehiscence, no significant erythema, Honeycomb dressing  Uterine Fundus: firm, involution -1 Lochia: appropriate Skin: Warm, Dry. DVT Evaluation: No evidence of DVT seen on physical exam. Negative Homan's sign. No cords or calf tenderness. No significant calf/ankle edema.  Labs: Recent Labs    01/24/23 0439 01/24/23 0850 01/25/23 0440  HGB 11.5* 10.3* 9.5*  HCT 37.3 33.2* 30.6*  WBC 12.2* 11.5* 8.2    CBG (last 3)  No results for input(s): "GLUCAP" in the last 72 hours.   I/O: I/O last 3 completed shifts: In: 6821.8 [P.O.:1860; I.V.:3715.4; Other:89.8; IV Piggyback:1156.7] Out: 6517 [Urine:6277; Blood:240]   Assessment Postpartum Postoperative Day # 1  Deatra Sullivan, [id number], [redacted]w[redacted]d, S/P Primary LT Cesarean Section due to pt was admitted on 6/28 for PreE with SF. Was monitor but had refractory HA and proceeded with IOL at 33.6 weeks, she progressed with foley bulb, Pitocin and AROM to 6-7 CM, then was brought to the OR on 7/1 for PCS with DR Su Hilt for fetal intolerance for labor, and BTL. QBL was , hgb drop 11.1-9.5, on PO Iron and asymptomatic, during labor her AST was slightly elevated but returned to normal AST 17-14-48-19, ALT 16-16-18-13, PCR was 0.09, other labs unremarkable, pt was placed on Mag through labor and 24 hours PP, was on procardia during pregnancy in third trimester but was stopped during labor progress and has not been restarted, PP BP have been normotensive 126/78.  Had viable baby female went to the NICU. Pt stable. -1 Involution. Breast Pump Feeding. Hemodynamically Stable.  Plan: Continue other mgmt as ordered ABLA: PO Iron PreE with SF: Monitor BP, will start procardia if needed VTE Prophylactics: SCD, ambulated as tolerates.  Pain control: Motrin/Tylenol/Narcotics PRN. Education given regarding options for  contraception, including barrier methods, injectable contraception, IUD placement.  Lactation consult  Dr. Alyse Low to be updated on patient status  [facility], FNP-C, PMHNP-BC  [address]  [place], [place] [address]  Cell: [phone]  Office Phone: [phone] Fax: [phone] 01/25/2023  7:40 AM

## 2023-01-25 NOTE — Progress Notes (Signed)
Patient screened out for psychosocial assessment since none of the following apply:  Psychosocial stressors documented in mother or baby's chart  Gestation less than 32 weeks  Code at delivery   Infant with anomalies Please contact the Clinical Social Worker if specific needs arise, by MOB's request, or if MOB scores greater than 9/yes to question 10 on Edinburgh Postpartum Depression Screen.  Rafiq Bucklin, LCSW Clinical Social Worker [facility] Cell#: [phone]     

## 2023-01-26 ENCOUNTER — Ambulatory Visit: Payer: 59

## 2023-01-26 MED ORDER — NIFEDIPINE ER OSMOTIC RELEASE 60 MG PO TB24: 60.0000 mg | ORAL_TABLET | Freq: Every day | ORAL | Status: DC

## 2023-01-26 MED ORDER — NIFEDIPINE ER OSMOTIC RELEASE 60 MG PO TB24
60.0000 mg | ORAL_TABLET | Freq: Every day | ORAL | Status: DC
  Administered 2023-01-27: 60 mg via ORAL
  Filled 2023-01-26: qty 1

## 2023-01-26 MED ORDER — NIFEDIPINE ER OSMOTIC RELEASE 30 MG PO TB24
30.0000 mg | ORAL_TABLET | Freq: Once | ORAL | Status: AC
  Administered 2023-01-26: 30 mg via ORAL
  Filled 2023-01-26: qty 1

## 2023-01-26 MED ORDER — POLYETHYLENE GLYCOL 3350 17 G PO PACK
17.0000 g | PACK | Freq: Every day | ORAL | Status: DC | PRN
  Administered 2023-01-26: 17 g via ORAL
  Filled 2023-01-26: qty 1

## 2023-01-26 NOTE — Progress Notes (Signed)
Subjective: Postpartum Day 2: Cesarean Delivery and Sterilization via BS at 33 6/7wks. Patient reports tolerating PO and no problems voiding.   Denies HA, visual changes or abdominal pain.  Objective: Vital signs in last 24 hours: Temp:  [97.7 F (36.5 C)-99.1 F (37.3 C)] 98.4 F (36.9 C) (07/03 0805) Pulse Rate:  [71-93] 71 (07/03 0805) Resp:  [18-22] 22 (07/03 0805) BP: (127-150)/(61-81) 144/80 (07/03 0805) SpO2:  [96 %-100 %] 100 % (07/03 0805)  Physical Exam:  General: alert and no distress Lochia: appropriate Uterine Fundus: FF, NT Incision: dressing c/d/i DVT Evaluation: No evidence of DVT seen on physical exam.  Recent Labs    01/24/23 0850 01/25/23 0440  HGB 10.3* 9.5*  HCT 33.2* 30.6*    Assessment/Plan: Status post Cesarean section. Doing well postoperatively.  Continue current care. BPs still mildly elevated this morning s/p procardia xl 30mg  last night.  Will give another dose now and increase to 60mg  daily. Baby in NICU overall doing well.  Purcell Nails, MD 01/26/2023, 11:50 AM

## 2023-01-26 NOTE — Lactation Note (Signed)
This note was copied from a baby's chart.  NICU Lactation Consultation Note  Patient Name: Annette Sullivan ZOXWR'U Date: 01/26/2023 Age:37 hours  Reason for consult: Follow-up assessment; NICU baby; Late-preterm 34-36.6wks; Infant < 6lbs  SUBJECTIVE  LC in to visit with P3 Mom of preterm infant in  the NICU.  Mom resting in bed.  Mom reports she has been pumping consistently and seeing drops of colostrum.  Not enough to bring to baby yet.   Encouraged STS with baby and continue frequent, consistent pumping. Engorgement prevention and treatment reviewed.   Mom aware of lactation support available to her and encouraged to have RN call LC prn.  OBJECTIVE Infant data: No data recorded Infant feeding assessment Scale for Readiness: 3   Maternal data: [id number]  C-Section, Low Transverse Pumping frequency: 8 times per 24 hrs Pumped volume: 0 mL (drops) Flange Size: 24  Pump: Personal (Aeroflow DEBP from insurance)  ASSESSMENT Infant: Feeding Status: Scheduled 8-11-2-5  Maternal: Milk volume: Normal  INTERVENTIONS/PLAN Interventions: Interventions: DEBP; Skin to skin; Breast massage; Hand express Discharge Education: Engorgement and breast care Tools: Flanges; Pump  Plan: Consult Status: NICU follow-up NICU Follow-up type: Verify onset of copious milk; Verify absence of engorgement; Maternal D/C visit   Judee Clara 01/26/2023, 10:27 AM

## 2023-01-26 NOTE — Lactation Note (Signed)
This note was copied from a baby's chart.  NICU Lactation Consultation Note  Patient Name: Annette Sullivan ZOXWR'U Date: 01/26/2023 Age:37 hours  Reason for consult: Follow-up assessment; NICU baby; Preterm <34wks; Infant < 6lbs; Other (Comment) (GHTN, AMA)  SUBJECTIVE  LC met with P4 Mom of preterm baby in the NICU.  Mom states she pumped twice yesterday but plans to increase her pumping today while LC encouraged Mom to do.  Mom reports that she doesn't get anything, just drops.  Reviewed how this early pumping will result in a fuller milk supply when milk comes to volume.  Mom nodded.    Encouraged consistent pumping with a goal of 8+ times per 24 hrs.  Mom has bottles and labels to transport EBM to baby  in the NICU.  OBJECTIVE Infant data: No data recorded Infant feeding assessment Scale for Readiness: 3   Maternal data: [id number]  C-Section, Low Transverse Pumping frequency: twice yesterday, and once this am.  Encouraged pumping more frequently today Pumped volume: 0 mL (drops) Flange Size: 24  Pump: Personal (Aeroflow DEBP from insurance)  ASSESSMENT Infant: Feeding Status: Scheduled 8-11-2-5  Maternal: Milk volume: Normal  INTERVENTIONS/PLAN Interventions: Interventions: Skin to skin; Breast massage; Hand express; DEBP Tools: Pump; Flanges; Hands-free pumping top  Plan: Consult Status: NICU follow-up NICU Follow-up type: Verify onset of copious milk; Verify absence of engorgement   Judee Clara 01/26/2023, 8:18 AM

## 2023-01-27 MED ORDER — LISINOPRIL 10 MG PO TABS: 10.0000 mg | ORAL_TABLET | Freq: Every day | ORAL | Status: DC

## 2023-01-27 MED ORDER — NIFEDIPINE ER OSMOTIC RELEASE 60 MG PO TB24
90.0000 mg | ORAL_TABLET | Freq: Every day | ORAL | Status: DC
  Administered 2023-01-28: 90 mg via ORAL
  Filled 2023-01-27: qty 1

## 2023-01-27 MED ORDER — NIFEDIPINE ER 60 MG PO TB24: 60.0000 mg | ORAL_TABLET | Freq: Every day | ORAL | 0 refills | Status: AC

## 2023-01-27 MED ORDER — OXYCODONE HCL 5 MG PO TABS: 5.0000 mg | ORAL_TABLET | ORAL | 0 refills | Status: DC | PRN

## 2023-01-27 MED ORDER — NIFEDIPINE ER OSMOTIC RELEASE 30 MG PO TB24
30.0000 mg | ORAL_TABLET | Freq: Once | ORAL | Status: AC
  Administered 2023-01-27: 30 mg via ORAL
  Filled 2023-01-27: qty 1

## 2023-01-27 MED ORDER — IBUPROFEN 600 MG PO TABS: 600.0000 mg | ORAL_TABLET | Freq: Four times a day (QID) | ORAL | 0 refills | Status: AC

## 2023-01-27 MED ORDER — ENALAPRIL MALEATE 2.5 MG PO TABS
5.0000 mg | ORAL_TABLET | Freq: Every day | ORAL | Status: DC
  Administered 2023-01-27 – 2023-01-28 (×2): 5 mg via ORAL
  Filled 2023-01-27 (×2): qty 2

## 2023-01-27 MED ORDER — POLYSACCHARIDE IRON COMPLEX 150 MG PO CAPS: 150.0000 mg | ORAL_CAPSULE | Freq: Every day | ORAL | 0 refills | Status: AC

## 2023-01-27 NOTE — Discharge Summary (Signed)
Discharge was cancelled for 01/27/23.

## 2023-01-27 NOTE — Lactation Note (Signed)
This note was copied from a baby's chart.  NICU Lactation Consultation Note  Patient Name: Girl Monya Deadrick ZOXWR'U Date: 01/27/2023 Age:37 years  Reason for consult: Follow-up assessment; NICU baby; Preterm <34wks; Infant < 6lbs  SUBJECTIVE  LC in to visit with P3 Mom of preterm infant in the NICU.  Baby "[name]" is on HFNC 2L at 21%.  Baby is on scheduled feeds of donor breast milk by gavage.  Mom is still admitted due to her BP.  She is exhausted and hopes to go home and sleep.   Pumping is still not consistent, but today she plans to increase to 6-8 times per 24 hrs and take her pump parts to baby's room in the NICU to pump after doing STS with baby.    Engorgement prevention and treatment reviewed.  Mom denies any questions at present, and understands to lactation support available in the NICU.    OBJECTIVE Infant data: No data recorded Infant feeding assessment Scale for Readiness: 5   Maternal data: [id number]  C-Section, Low Transverse Pumping frequency: 4 times yesterday, encouraged to increase frequency to 6-8 times per 24 hrs today Pumped volume: 0 mL (drops) Flange Size: 24  Pump: Personal, DEBP  ASSESSMENT Infant: Feeding Status: Scheduled 8-11-2-5  Maternal: Milk volume: Normal  INTERVENTIONS/PLAN Interventions: Interventions: Skin to skin; Breast massage; Hand express; DEBP; Education Discharge Education: Engorgement and breast care Tools: Pump; Flanges; Coconut oil Pump Education: Setup, frequency, and cleaning; Milk Storage  Plan: Consult Status: NICU follow-up NICU Follow-up type: Verify onset of copious milk; Verify absence of engorgement; Maternal D/C visit   Judee Clara 01/27/2023, 10:15 AM

## 2023-01-27 NOTE — Progress Notes (Addendum)
Post Partum Day 3 Subjective: Patient has no complaints. She is tolerating a regular diet. Her pain is well controlled. She is voiding and ambulating without difficulty.   Objective: Blood pressure (!) 144/74, pulse 74, temperature 98.4 F (36.9 C), temperature source Oral, resp. rate 17, height 5\' 4"  (1.626 m), weight 105.8 kg, last menstrual period 06/01/2022, SpO2 98 %, unknown if currently breastfeeding.    01/27/2023    3:46 PM 01/27/2023   11:22 AM 01/27/2023    8:00 AM  Vitals with BMI  Systolic 144 148 409  Diastolic 74 71 67  Pulse 74 98 79     Physical Exam:  General: alert, cooperative, and no distress Lochia: appropriate Uterine Fundus: firm Incision: no significant drainage, with honey comb dressing.  DVT Evaluation: No evidence of DVT seen on physical exam. With mild bilateral leg edema.   Recent Labs    01/25/23 0440  HGB 9.5*  HCT 30.6*    Assessment/Plan: 37 y/o female [id number] POD # 4 after a primary csection for abnormal fetal heart tracing, with Preeclampsia with severe features, s/p 24 hrs postpartum magnesium sulfate,  - With continued elevated blood pressure in the postpartum period and was on procardia XL 60 mg PO Q daily.  30 mg PO XL Procardia was added to prior dosage of 60 mg PO XL with continued elevated blood pressures. In the afternoon Ibuprofen was stopped and enalapril 5 mg PO Q daily was added to the regimen.  Continue with close blood pressure follow up.  - Continue with routine post-op care.  - Iron tabs for anemia.     LOS: 6 days   Prescilla Sours, MD 01/27/2023, 5:04 PM

## 2023-01-28 MED ORDER — OXYCODONE HCL 5 MG PO TABS: 5.0000 mg | ORAL_TABLET | Freq: Four times a day (QID) | ORAL | 0 refills | Status: DC | PRN

## 2023-01-28 MED ORDER — NIFEDIPINE ER OSMOTIC RELEASE 90 MG PO TB24: 90.0000 mg | ORAL_TABLET | Freq: Every day | ORAL | 2 refills | Status: AC

## 2023-01-28 MED ORDER — OXYCODONE HCL 5 MG PO TABS: 5.0000 mg | ORAL_TABLET | Freq: Four times a day (QID) | ORAL | 0 refills | Status: AC | PRN

## 2023-01-28 NOTE — Discharge Summary (Signed)
OB Discharge Summary     Patient Name: Annette Sullivan DOB: 10/30/1985 MRN: [id number]  Date of admission: 01/21/2023 Delivering MD: Osborn Coho   Date of discharge: 01/28/2023  Admitting diagnosis: Preeclampsia, severe [O14.10] Gestational hypertension [O13.9] Intrauterine pregnancy: [redacted]w[redacted]d     Secondary diagnosis:  Principal Problem:   Preeclampsia, severe Active Problems:   Gestational hypertension   Acute blood loss anemia   S/P cesarean section   S/P tubal ligation  Additional problems: None     Discharge diagnosis: Preterm Pregnancy Delivered and Preeclampsia (severe)                                                                                                Post partum procedures:postpartum tubal ligation  Augmentation: AROM, Pitocin, and Cytotec  Complications: Fetal intolerance to labor and hand presentation  Hospital course:  Induction of Labor With Cesarean Section   37 y.o. yo [id number] at [redacted]w[redacted]d was admitted to the hospital 01/21/2023 for induction of labor. Patient had a labor course significant for pre-eclampsia w/ severe features. The patient went for cesarean section due to Non-Reassuring FHR. Delivery details are as follows: Membrane Rupture Time/Date: 1:53 AM ,01/24/2023   Delivery Method:C-Section, Low Transverse  Details of operation can be found in separate operative Note.  Patient had a postpartum course complicated by none. She is ambulating, tolerating a regular diet, passing flatus, and urinating well.  Patient is discharged home in stable condition on 01/28/23.      Newborn Data: Birth date:01/24/2023  Birth time:2:32 AM  Gender:Female  Living status:Living  Apgars:8 ,9  Weight:2050 g                                Magnesium Sulfate received: Yes: Seizure prophylaxis BMZ received: Yes Rhophylac:N/A MMR:N/A Flu: N/A Transfusion:No  Physical exam  Vitals:   01/27/23 2339 01/28/23 0521 01/28/23 0825 01/28/23 1216  BP: 131/73 129/74 (!) 141/83  133/72  Pulse: 80 74 74 87  Resp: 18 18 17 18   Temp: 98.5 F (36.9 C) 97.7 F (36.5 C) 98.1 F (36.7 C) 98.6 F (37 C)  TempSrc: Oral Oral Oral Oral  SpO2: 100% 98% 99% 100%  Weight:      Height:       General: alert, cooperative, and no distress Lochia: appropriate Uterine Fundus: firm Incision: Healing well with no significant drainage, No significant erythema, Dressing is clean, dry, and intact DVT Evaluation: No evidence of DVT seen on physical exam. No significant calf/ankle edema. Labs: Lab Results  Component Value Date   WBC 8.2 01/25/2023   HGB 9.5 (L) 01/25/2023   HCT 30.6 (L) 01/25/2023   MCV 88.4 01/25/2023   PLT 179 01/25/2023      Latest Ref Rng & Units 01/24/2023    8:50 AM  CMP  Glucose 70 - 99 mg/dL 147   BUN 6 - 20 mg/dL <5   Creatinine 8.29 - 1.00 mg/dL 5.62   Sodium 130 - 865 mmol/L 133   Potassium 3.5 - 5.1 mmol/L 3.8  Chloride 98 - 111 mmol/L 103   CO2 22 - 32 mmol/L 21   Calcium 8.9 - 10.3 mg/dL 7.6   Total Protein 6.5 - 8.1 g/dL 5.6   Total Bilirubin 0.3 - 1.2 mg/dL 0.7   Alkaline Phos 38 - 126 U/L 66   AST 15 - 41 U/L 19   ALT 0 - 44 U/L 13     Discharge instruction: per After Visit Summary and "Baby and Me Booklet".  After visit meds:  Allergies as of 01/28/2023   No Known Allergies      Medication List     STOP taking these medications    aspirin EC 81 MG tablet       TAKE these medications    cholecalciferol 25 MCG (1000 UNIT) tablet Commonly known as: VITAMIN D3 Take 4,000 Units by mouth daily.   ibuprofen 600 MG tablet Commonly known as: ADVIL Take 1 tablet (600 mg total) by mouth every 6 (six) hours.   iron polysaccharides 150 MG capsule Commonly known as: NIFEREX Take 1 capsule (150 mg total) by mouth daily for 40 doses.   NIFEdipine 60 MG 24 hr tablet Commonly known as: ADALAT CC Take 1 tablet (60 mg total) by mouth daily for 40 doses. What changed:  medication strength how much to take   NIFEdipine 90  MG 24 hr tablet Commonly known as: PROCARDIA XL/NIFEDICAL-XL Take 1 tablet (90 mg total) by mouth daily. Start taking on: January 29, 2023 What changed: You were already taking a medication with the same name, and this prescription was added. Make sure you understand how and when to take each.   oxyCODONE 5 MG immediate release tablet Commonly known as: Oxy IR/ROXICODONE Take 1-2 tablets (5-10 mg total) by mouth every 4 (four) hours as needed for moderate pain.   PRENATAL GUMMIES PO Take by mouth.       Discontinued Enalapril as patient is pumping with the intent to breast feed.   Diet: routine diet and low salt diet  Activity: Advance as tolerated. Pelvic rest for 6 weeks.   Outpatient follow up:1 week Follow up Appt:No future appointments. Follow up Visit:No follow-ups on file.  Postpartum contraception: Tubal Ligation  Newborn Data: Live born female  Birth Weight: 4 lb 8.3 oz (2050 g) APGAR: 8, 9  Newborn Delivery   Birth date/time: 01/24/2023 02:32:00 Delivery type: C-Section, Low Transverse Trial of labor: Yes C-section categorization: Primary      Baby Feeding:  pumping as infant in NICU Disposition:NICU  Edinburgh Score:    01/24/2023    3:11 PM  Edinburgh Postnatal Depression Scale Screening Tool  I have been able to laugh and see the funny side of things. 0  I have looked forward with enjoyment to things. 0  I have blamed myself unnecessarily when things went wrong. 0  I have been anxious or worried for no good reason. 0  I have felt scared or panicky for no good reason. 0  Things have been getting on top of me. 0  I have been so unhappy that I have had difficulty sleeping. 2  I have felt sad or miserable. 0  I have been so unhappy that I have been crying. 0  The thought of harming myself has occurred to me. 0  Edinburgh Postnatal Depression Scale Total 2    01/28/2023 Jackie Plum, MD

## 2023-01-28 NOTE — Plan of Care (Signed)
Problem: Education: Goal: Knowledge of disease or condition will improve Outcome: Adequate for Discharge Goal: Knowledge of the prescribed therapeutic regimen will improve Outcome: Adequate for Discharge Goal: Individualized Educational Video(s) Outcome: Adequate for Discharge   Problem: Clinical Measurements: Goal: Complications related to the disease process, condition or treatment will be avoided or minimized Outcome: Adequate for Discharge   Problem: Education: Goal: Knowledge of General Education information will improve Description: Including pain rating scale, medication(s)/side effects and non-pharmacologic comfort measures Outcome: Adequate for Discharge   Problem: Health Behavior/Discharge Planning: Goal: Ability to manage health-related needs will improve Outcome: Adequate for Discharge   Problem: Clinical Measurements: Goal: Ability to maintain clinical measurements within normal limits will improve Outcome: Adequate for Discharge Goal: Will remain free from infection Outcome: Adequate for Discharge Goal: Diagnostic test results will improve Outcome: Adequate for Discharge Goal: Respiratory complications will improve Outcome: Adequate for Discharge Goal: Cardiovascular complication will be avoided Outcome: Adequate for Discharge   Problem: Activity: Goal: Risk for activity intolerance will decrease Outcome: Adequate for Discharge   Problem: Nutrition: Goal: Adequate nutrition will be maintained Outcome: Adequate for Discharge   Problem: Coping: Goal: Level of anxiety will decrease Outcome: Adequate for Discharge   Problem: Elimination: Goal: Will not experience complications related to bowel motility Outcome: Adequate for Discharge Goal: Will not experience complications related to urinary retention Outcome: Adequate for Discharge   Problem: Pain Managment: Goal: General experience of comfort will improve Outcome: Adequate for Discharge   Problem:  Safety: Goal: Ability to remain free from injury will improve Outcome: Adequate for Discharge   Problem: Skin Integrity: Goal: Risk for impaired skin integrity will decrease Outcome: Adequate for Discharge   Problem: Education: Goal: Knowledge of disease or condition will improve Outcome: Adequate for Discharge Goal: Knowledge of the prescribed therapeutic regimen will improve Outcome: Adequate for Discharge   Problem: Fluid Volume: Goal: Peripheral tissue perfusion will improve Outcome: Adequate for Discharge   Problem: Clinical Measurements: Goal: Complications related to disease process, condition or treatment will be avoided or minimized Outcome: Adequate for Discharge   Problem: Education: Goal: Knowledge of Childbirth will improve Outcome: Adequate for Discharge Goal: Ability to make informed decisions regarding treatment and plan of care will improve Outcome: Adequate for Discharge Goal: Ability to state and carry out methods to decrease the pain will improve Outcome: Adequate for Discharge Goal: Individualized Educational Video(s) Outcome: Adequate for Discharge   Problem: Coping: Goal: Ability to verbalize concerns and feelings about labor and delivery will improve Outcome: Adequate for Discharge   Problem: Life Cycle: Goal: Ability to make normal progression through stages of labor will improve Outcome: Adequate for Discharge Goal: Ability to effectively push during vaginal delivery will improve Outcome: Adequate for Discharge   Problem: Role Relationship: Goal: Will demonstrate positive interactions with the child Outcome: Adequate for Discharge   Problem: Safety: Goal: Risk of complications during labor and delivery will decrease Outcome: Adequate for Discharge   Problem: Pain Management: Goal: Relief or control of pain from uterine contractions will improve Outcome: Adequate for Discharge   Problem: Education: Goal: Knowledge of the prescribed  therapeutic regimen will improve Outcome: Adequate for Discharge Goal: Understanding of sexual limitations or changes related to disease process or condition will improve Outcome: Adequate for Discharge Goal: Individualized Educational Video(s) Outcome: Adequate for Discharge   Problem: Self-Concept: Goal: Communication of feelings regarding changes in body function or appearance will improve Outcome: Adequate for Discharge   Problem: Skin Integrity: Goal: Demonstration of wound healing without  infection will improve Outcome: Adequate for Discharge   Problem: Education: Goal: Knowledge of condition will improve Outcome: Adequate for Discharge Goal: Individualized Educational Video(s) Outcome: Adequate for Discharge Goal: Individualized Newborn Educational Video(s) Outcome: Adequate for Discharge   Problem: Activity: Goal: Will verbalize the importance of balancing activity with adequate rest periods Outcome: Adequate for Discharge Goal: Ability to tolerate increased activity will improve Outcome: Adequate for Discharge   Problem: Coping: Goal: Ability to identify and utilize available resources and services will improve Outcome: Adequate for Discharge   Problem: Life Cycle: Goal: Chance of risk for complications during the postpartum period will decrease Outcome: Adequate for Discharge   Problem: Role Relationship: Goal: Ability to demonstrate positive interaction with newborn will improve Outcome: Adequate for Discharge   Problem: Skin Integrity: Goal: Demonstration of wound healing without infection will improve Outcome: Adequate for Discharge

## 2023-01-28 NOTE — Lactation Note (Signed)
This note was copied from a baby'Annette chart.  NICU Lactation Consultation Note  Patient Name: Annette Sullivan [id number] Date: 01/28/2023 Age:36 days  Reason for consult: Follow-up assessment; NICU baby; Late-preterm 34-36.6wks; Infant < 6lbs; Maternal discharge; Other (Comment) (AMA, gHTN, Pre-E)  SUBJECTIVE Visited with family of 19 days old LPI NICU female; Annette Sullivan is a P4 and reports she'Annette pumping but not consistently. Explained the importance of consistent pumping for the prevention of engorgement and to protect her supply. She voiced she finally started getting enough EBM to collect to take to baby "Annette Sullivan" in the NICU, praised them for their efforts. Reviewed discharge education, pumping schedule, pump settings, lactogenesis II/III, benefits of premature milk and anticipatory guidelines.  OBJECTIVE Infant data: Mother'Annette Current Feeding Choice: Breast Milk and Donor Milk  Infant feeding assessment Scale for Readiness: 3   Maternal data: [id number]  C-Section, Low Transverse Pumping frequency: 2 times/24 hours Pumped volume: 10 mL Flange Size: 24 Risk factor for low milk supply:: prematurity, infant separation, inconsistent pumping as on 01/28/2023  Pump: Personal (Aeroflow DEBP from insurance)  ASSESSMENT Infant: Feeding Status: Scheduled 8-11-2-5  Maternal: Milk volume: Low  INTERVENTIONS/PLAN Interventions: Interventions: Breast feeding basics reviewed; DEBP; Education Discharge Education: Engorgement and breast care Tools: Pump; Flanges; Coconut oil Pump Education: Setup, frequency, and cleaning; Milk Storage  Plan: Encouraged pumping every 3 hours, ideally 8 pumping sessions/24 hours She'll take all pump parts to baby'Annette room after her discharge She'll switch her pump settings from initiation to maintenance mode once she gets 20 ml of EBM combined She'll call for assistance when baby is ready to go to breast   No other support person at this time. All questions and  concerns answered, family to contact [facility] services PRN.  Consult Status: NICU follow-up NICU Follow-up type: Weekly NICU follow up; Verify absence of engorgement; Verify onset of copious milk   Annette Sullivan Annette Sullivan 01/28/2023, 1:39 PM

## 2023-02-01 ENCOUNTER — Ambulatory Visit: Payer: 59

## 2023-02-02 ENCOUNTER — Ambulatory Visit (HOSPITAL_COMMUNITY): Payer: Self-pay

## 2023-02-02 ENCOUNTER — Ambulatory Visit: Payer: 59

## 2023-02-02 NOTE — Lactation Note (Signed)
This note was copied from a baby's chart.  NICU Lactation Consultation Note  Patient Name: Annette Sullivan [id number] Date: 02/02/2023 Age:37 days  Reason for consult: Weekly NICU follow-up; NICU baby; Late-preterm 34-36.6wks; Other (Comment); Infant < 6lbs (AMA, gHTN, Pre-E)  SUBJECTIVE Visited with family of 19 63/60 weeks old AGA NICU female; Annette Sullivan is a P4 and experienced breastfeeding. She requested a feeding assist for the 11 am feeding; baby "Annette Sullivan" already awake but not showing cues; documented this attempt on flowsheets (see LATCH score). She reported that pumping is stressful; noticed that she's not pumping consistently and her supply is BNL. Explained the importance of consistent pumping to protect her supply; she voiced that it was so different with her other babies where she just took them to breast and that was all she had to worry about, this is her first baby in NICU. Provided emotional support and empathy and let her know that even if she can't do the 8 pumping sessions, the closer she can get to that goal, the more increase she'll see in her supply overtime. Reviewed pumping schedule, lactogenesis III, IDF 1/2 and anticipatory guidelines.  OBJECTIVE Infant data: Mother's Current Feeding Choice: Breast Milk and Formula  Infant feeding assessment Scale for Readiness: 3   Maternal data: [id number]  C-Section, Low Transverse Pumping frequency: 2 times/24 hours Pumped volume: 35 mL Flange Size: 24  Pump: Personal (Aeroflow DEBP from insurance)  ASSESSMENT Infant: LATCH Documentation Latch: 0 (baby wasn't really cueing, she had a the hiccups) Audible Swallowing: 0 Type of Nipple: 2 Comfort (Breast/Nipple): 2 Hold (Positioning): 1 LATCH Score: 5  Feeding Status: Scheduled 8-11-2-5  Maternal: Milk volume: Low  INTERVENTIONS/PLAN Interventions: Interventions: Breast feeding basics reviewed; Assisted with latch; Skin to skin; Breast massage; Hand express; Adjust  position; Support pillows; DEBP; Coconut oil; Education; Infant Driven Feeding Algorithm education Tools: Pump; Flanges; Coconut oil Pump Education: Setup, frequency, and cleaning; Milk Storage  Plan: Encouraged pumping every 3 hours, ideally 8 pumping sessions/24 hours or the closest she could get to that goal She'll continue taking baby to breast on feeding cues around feeding times. No pumping needed at this point due to maternal supply, but she's aware she'll need to start pre-pumping if baby is having a hard time pacing herself   No other support person at this time. All questions and concerns answered, family to contact [facility] services PRN.  Consult Status: NICU follow-up NICU Follow-up type: Weekly NICU follow up   Jkayla Spiewak S [name] 02/02/2023, 11:27 AM

## 2023-02-04 ENCOUNTER — Ambulatory Visit (HOSPITAL_COMMUNITY): Payer: Self-pay

## 2023-02-04 NOTE — Lactation Note (Signed)
This note was copied from a baby's chart.  NICU Lactation Consultation Note  Patient Name: Annette Sullivan [id number] Date: 02/04/2023 Age:37 days  Reason for consult: Follow-up assessment; Weekly NICU follow-up; NICU baby; Late-preterm 34-36.6wks; Infant < 6lbs  SUBJECTIVE  LC in to visit with P4 Mom of baby "Annette Sullivan".  Baby is starting to show interest in po feeding, scoring 2's and 3's recently.  After speaking with LC a few days ago, Annette Sullivan has increased her pumping frequency.  She is now pumping 8 times per 24 hrs and expressing 35 ml.    LC set up DEBP in room and encouraged pumping while visiting baby.  Mom has a pump she obtained from her insurance, but it isn't as strong as the [employer] pump.  Mom asked if she had WIC, she does, and has an appointment on 7/16.  Offered a [facility] loaner, Mom would like to obtain the [employer].  Message left for [facility] on tomorrow.  2 handouts provided for ways to increase milk supply when baby is in NICU.  Talked about hands on pumping and warming of the flanges.    Mom will notify her baby's RN to call [facility] when she is here tomorrow and wanting the [facility] loaner.  OBJECTIVE Infant data: No data recorded Infant feeding assessment Scale for Readiness: 2   Maternal data: [id number] C-Section, Low Transverse Pumping frequency: Increased to every 3 hrs Pumped volume: 35 mL Flange Size: 24  WIC Program: Yes WIC Referral Sent?: Yes What county?: [place] Pump: Personal (Offered a WIC loaner pump, received a pump through insurance that is sub-par and not working as well as [employer])  ASSESSMENT Infant: Feeding Status: Scheduled 8-11-2-5  Maternal: Milk volume: Low  INTERVENTIONS/PLAN Interventions: Interventions: Skin to skin; Breast massage; Hand express; DEBP; Education Tools: Pump; Flanges Pump Education: Setup, frequency, and cleaning; Milk Storage  Plan: Consult Status: NICU follow-up NICU Follow-up type: Weekly NICU follow  up   Annette Sullivan 02/04/2023, 4:45 PM

## 2023-02-13 ENCOUNTER — Ambulatory Visit (HOSPITAL_COMMUNITY): Payer: Self-pay

## 2023-02-13 NOTE — Lactation Note (Addendum)
This note was copied from a baby's chart.  NICU Lactation Consultation Note  Patient Name: Annette Sullivan [id number] Date: 02/13/2023 Age:37 wk.o.  Reason for consult: Follow-up assessment; NICU baby; Late-preterm 34-36.6wks; Infant < 6lbs  SUBJECTIVE  LC in to visit with P4 Mom of baby "Annette Sullivan" [redacted]w[redacted]d AGA Mom reports she pumped 8 times yesterday, expressing 30 ml each time.  Encouraged her to keep up the consistency as baby was growing and her volume will pick up with more pumping and longer breastfeeds. Mom states she was exhausted pumping that much. Mom reports baby is cueing and wanting to latch.  Baby latched on for 2 mins at last feeding. Mom asked for lactation assistance tomorrow at 2 pm.   Appt made.  OBJECTIVE Infant data: No data recorded Infant feeding assessment Scale for Readiness: 2 Scale for Quality: 2   Maternal data: [id number] C-Section, Low Transverse Pumping frequency: yesterday, pumped 8 times/24 hrs Pumped volume: 30 mL Flange Size: 24  WIC Program: Yes WIC Referral Sent?: Yes What county?: [place] Pump: Personal (Offered a WIC loaner pump, received a pump through insurance that is sub-par and not working as well as [employer])  ASSESSMENT Infant: Feeding Status: Scheduled 8-11-2-5  Maternal: Milk volume: Low  INTERVENTIONS/PLAN Interventions: Interventions: Skin to skin; Breast massage; Hand express; DEBP; Education Tools: Pump; Flanges  Plan: Consult Status: NICU follow-up NICU Follow-up type: Assist with IDF-2 (Mother does not need to pre-pump before breastfeeding)   Annette Sullivan 02/13/2023, 4:27 PM

## 2023-02-14 ENCOUNTER — Ambulatory Visit (HOSPITAL_COMMUNITY): Payer: Self-pay

## 2023-02-14 NOTE — Lactation Note (Signed)
This note was copied from a baby's chart.  NICU Lactation Consultation Note  Patient Name: Annette Sullivan [id number] Date: 02/14/2023 Age:37 wk.o.  Reason for consult: Weekly NICU follow-up; NICU baby; Late-preterm 34-36.6wks; Infant < 6lbs; Other (Comment) (gHTN, AMA)  SUBJECTIVE Visited with family of 37 110/66 weeks old AGA NICU female; [name] is a P4 and experienced breastfeeding. This LC came to assist with the 2 pm feeding but baby "[name]" did not wake up to feed. [name] voiced that breastfeeding and pumping are going well, she feels comfortable practicing independent breastfeeding and she's also pumping more consistently now; she got another pump from the [facility] office. She requested a pump for baby's room, baby was moved to a different room without a pump. LC provided one and let NICU charge RN know. [name] showed this LC three supplements she got to increase her supply; one of them had [place] in the ingredient list. Let her know that they will only work with nipples stimulation (consistent pumping, breastfeeding, both) and praised her for her efforts. Reviewed pumping schedule, lactogenesis III, strategies to increase supply, galactagogues and anticipatory guidelines.  OBJECTIVE Infant data: Mother's Current Feeding Choice: Breast Milk and Formula  Infant feeding assessment Scale for Readiness: 1 Scale for Quality: 3 (intermittent stridor noted throughout feed)   Maternal data: [id number] C-Section, Low Transverse Pumping frequency: 6 times/24 hours Pumped volume: 20 mL (20-45 ml) Flange Size: 24  WIC Program: Yes WIC Referral Sent?: Yes What county?: [place] Pump: WIC Pump  ASSESSMENT Infant: Feeding Status: Scheduled 8-11-2-5  Maternal: Milk volume: Low  INTERVENTIONS/PLAN Interventions: Interventions: Breast feeding basics reviewed; DEBP; Education Tools: Pump; Flanges; Coconut oil Pump Education: Setup, frequency, and cleaning; Milk Storage  Plan: Encouraged  pumping every 3 hours, ideally 8 pumping sessions/24 hours or the closest she could get to that goal She'll start power pumping in the AM She'll continue taking baby to breast on feeding cues around feeding times and will call for assistance PRN Family will continue advancing on bottle feedings   No other support person at this time. All questions and concerns answered, family to contact [facility] services PRN.  Consult Status: NICU follow-up NICU Follow-up type: Weekly NICU follow up   [name] S [name] 02/14/2023, 2:25 PM

## 2023-02-21 ENCOUNTER — Telehealth (HOSPITAL_COMMUNITY): Payer: Self-pay | Admitting: *Deleted

## 2023-02-21 NOTE — Telephone Encounter (Signed)
02/21/2023  Name: TYEISHA PAUTSCH MRN: [id number] DOB: 1986-06-28  Reason for Call:  Transition of Care Hospital Discharge Call  Contact Status: Patient Contact Status: Complete  Language assistant needed: Interpreter Mode: Interpreter Not Needed        Follow-Up Questions: Do You Have Any Concerns About Your Health As You Heal From Delivery?: No Do You Have Any Concerns About Your Infants Health?: Infant in NICU  Edinburgh Postnatal Depression Scale:  In the Past 7 Days: I have been able to laugh and see the funny side of things.: As much as I always could I have looked forward with enjoyment to things.: As much as I ever did I have blamed myself unnecessarily when things went wrong.: No, never I have been anxious or worried for no good reason.: No, not at all I have felt scared or panicky for no good reason.: No, not at all Things have been getting on top of me.: No, I have been coping as well as ever I have been so unhappy that I have had difficulty sleeping.: Not at all I have felt sad or miserable.: No, not at all I have been so unhappy that I have been crying.: No, never The thought of harming myself has occurred to me.: Never Inocente Salles Postnatal Depression Scale Total: 0  PHQ2-9 Depression Scale:     Discharge Follow-up: Edinburgh score requires follow up?: No Patient was advised of the following resources:: Breastfeeding Support Group, Support Group Did patient express any COVID concerns?: No  Post-discharge interventions: NA  Salena Saner, RN 02/21/2023 14:01
